# Patient Record
Sex: Male | Born: 1940 | Race: White | Hispanic: No | Marital: Married | Smoking: Never smoker
Health system: Southern US, Community
[De-identification: ages and names within clinical notes are randomized; demographics above are authoritative.]

## PROBLEM LIST (undated history)

## (undated) DIAGNOSIS — I10 Essential (primary) hypertension: Secondary | ICD-10-CM

## (undated) DIAGNOSIS — E119 Type 2 diabetes mellitus without complications: Secondary | ICD-10-CM

---

## 2015-02-13 DIAGNOSIS — I10 Essential (primary) hypertension: Secondary | ICD-10-CM | POA: Diagnosis not present

## 2015-02-13 DIAGNOSIS — E1165 Type 2 diabetes mellitus with hyperglycemia: Secondary | ICD-10-CM | POA: Diagnosis not present

## 2015-02-15 DIAGNOSIS — Z125 Encounter for screening for malignant neoplasm of prostate: Secondary | ICD-10-CM | POA: Diagnosis not present

## 2015-02-15 DIAGNOSIS — E1165 Type 2 diabetes mellitus with hyperglycemia: Secondary | ICD-10-CM | POA: Diagnosis not present

## 2015-02-15 DIAGNOSIS — I10 Essential (primary) hypertension: Secondary | ICD-10-CM | POA: Diagnosis not present

## 2015-04-04 DIAGNOSIS — C61 Malignant neoplasm of prostate: Secondary | ICD-10-CM | POA: Diagnosis not present

## 2015-04-11 DIAGNOSIS — N138 Other obstructive and reflux uropathy: Secondary | ICD-10-CM | POA: Diagnosis not present

## 2015-04-11 DIAGNOSIS — N5201 Erectile dysfunction due to arterial insufficiency: Secondary | ICD-10-CM | POA: Diagnosis not present

## 2015-04-11 DIAGNOSIS — R972 Elevated prostate specific antigen [PSA]: Secondary | ICD-10-CM | POA: Diagnosis not present

## 2015-04-11 DIAGNOSIS — C61 Malignant neoplasm of prostate: Secondary | ICD-10-CM | POA: Diagnosis not present

## 2015-04-11 DIAGNOSIS — Z0000 Encounter for general adult medical examination without abnormal findings: Secondary | ICD-10-CM | POA: Diagnosis not present

## 2015-04-11 DIAGNOSIS — N401 Enlarged prostate with lower urinary tract symptoms: Secondary | ICD-10-CM | POA: Diagnosis not present

## 2015-05-14 DIAGNOSIS — Z0000 Encounter for general adult medical examination without abnormal findings: Secondary | ICD-10-CM | POA: Diagnosis not present

## 2015-05-14 DIAGNOSIS — I10 Essential (primary) hypertension: Secondary | ICD-10-CM | POA: Diagnosis not present

## 2015-05-14 DIAGNOSIS — E1165 Type 2 diabetes mellitus with hyperglycemia: Secondary | ICD-10-CM | POA: Diagnosis not present

## 2015-06-04 DIAGNOSIS — Z1211 Encounter for screening for malignant neoplasm of colon: Secondary | ICD-10-CM | POA: Diagnosis not present

## 2015-08-16 DIAGNOSIS — E1165 Type 2 diabetes mellitus with hyperglycemia: Secondary | ICD-10-CM | POA: Diagnosis not present

## 2015-08-16 DIAGNOSIS — E784 Other hyperlipidemia: Secondary | ICD-10-CM | POA: Diagnosis not present

## 2015-08-16 DIAGNOSIS — I10 Essential (primary) hypertension: Secondary | ICD-10-CM | POA: Diagnosis not present

## 2015-08-21 DIAGNOSIS — E1165 Type 2 diabetes mellitus with hyperglycemia: Secondary | ICD-10-CM | POA: Diagnosis not present

## 2015-08-21 DIAGNOSIS — I10 Essential (primary) hypertension: Secondary | ICD-10-CM | POA: Diagnosis not present

## 2015-08-21 DIAGNOSIS — E784 Other hyperlipidemia: Secondary | ICD-10-CM | POA: Diagnosis not present

## 2015-10-11 DIAGNOSIS — C61 Malignant neoplasm of prostate: Secondary | ICD-10-CM | POA: Diagnosis not present

## 2015-10-17 DIAGNOSIS — C61 Malignant neoplasm of prostate: Secondary | ICD-10-CM | POA: Diagnosis not present

## 2015-10-17 DIAGNOSIS — R972 Elevated prostate specific antigen [PSA]: Secondary | ICD-10-CM | POA: Diagnosis not present

## 2015-10-17 DIAGNOSIS — N5201 Erectile dysfunction due to arterial insufficiency: Secondary | ICD-10-CM | POA: Diagnosis not present

## 2015-11-16 DIAGNOSIS — E784 Other hyperlipidemia: Secondary | ICD-10-CM | POA: Diagnosis not present

## 2015-11-16 DIAGNOSIS — I10 Essential (primary) hypertension: Secondary | ICD-10-CM | POA: Diagnosis not present

## 2015-11-16 DIAGNOSIS — E1165 Type 2 diabetes mellitus with hyperglycemia: Secondary | ICD-10-CM | POA: Diagnosis not present

## 2016-01-08 DIAGNOSIS — Z23 Encounter for immunization: Secondary | ICD-10-CM | POA: Diagnosis not present

## 2016-02-15 DIAGNOSIS — I10 Essential (primary) hypertension: Secondary | ICD-10-CM | POA: Diagnosis not present

## 2016-02-15 DIAGNOSIS — E1165 Type 2 diabetes mellitus with hyperglycemia: Secondary | ICD-10-CM | POA: Diagnosis not present

## 2016-02-15 DIAGNOSIS — E784 Other hyperlipidemia: Secondary | ICD-10-CM | POA: Diagnosis not present

## 2016-05-12 DIAGNOSIS — E784 Other hyperlipidemia: Secondary | ICD-10-CM | POA: Diagnosis not present

## 2016-05-12 DIAGNOSIS — I10 Essential (primary) hypertension: Secondary | ICD-10-CM | POA: Diagnosis not present

## 2016-05-12 DIAGNOSIS — E1165 Type 2 diabetes mellitus with hyperglycemia: Secondary | ICD-10-CM | POA: Diagnosis not present

## 2016-06-04 DIAGNOSIS — Z125 Encounter for screening for malignant neoplasm of prostate: Secondary | ICD-10-CM | POA: Diagnosis not present

## 2016-06-04 DIAGNOSIS — E1165 Type 2 diabetes mellitus with hyperglycemia: Secondary | ICD-10-CM | POA: Diagnosis not present

## 2016-06-13 DIAGNOSIS — C61 Malignant neoplasm of prostate: Secondary | ICD-10-CM | POA: Diagnosis not present

## 2016-06-16 DIAGNOSIS — N5201 Erectile dysfunction due to arterial insufficiency: Secondary | ICD-10-CM | POA: Diagnosis not present

## 2016-06-16 DIAGNOSIS — C61 Malignant neoplasm of prostate: Secondary | ICD-10-CM | POA: Diagnosis not present

## 2016-06-16 DIAGNOSIS — N401 Enlarged prostate with lower urinary tract symptoms: Secondary | ICD-10-CM | POA: Diagnosis not present

## 2016-06-16 DIAGNOSIS — R972 Elevated prostate specific antigen [PSA]: Secondary | ICD-10-CM | POA: Diagnosis not present

## 2016-06-16 DIAGNOSIS — R350 Frequency of micturition: Secondary | ICD-10-CM | POA: Diagnosis not present

## 2016-08-12 DIAGNOSIS — E1165 Type 2 diabetes mellitus with hyperglycemia: Secondary | ICD-10-CM | POA: Diagnosis not present

## 2016-08-12 DIAGNOSIS — I10 Essential (primary) hypertension: Secondary | ICD-10-CM | POA: Diagnosis not present

## 2016-08-12 DIAGNOSIS — E784 Other hyperlipidemia: Secondary | ICD-10-CM | POA: Diagnosis not present

## 2016-09-19 DIAGNOSIS — H524 Presbyopia: Secondary | ICD-10-CM | POA: Diagnosis not present

## 2016-09-19 DIAGNOSIS — E119 Type 2 diabetes mellitus without complications: Secondary | ICD-10-CM | POA: Diagnosis not present

## 2016-09-19 DIAGNOSIS — H5203 Hypermetropia, bilateral: Secondary | ICD-10-CM | POA: Diagnosis not present

## 2016-09-19 DIAGNOSIS — H52223 Regular astigmatism, bilateral: Secondary | ICD-10-CM | POA: Diagnosis not present

## 2016-09-19 DIAGNOSIS — H25813 Combined forms of age-related cataract, bilateral: Secondary | ICD-10-CM | POA: Diagnosis not present

## 2016-11-12 DIAGNOSIS — E7849 Other hyperlipidemia: Secondary | ICD-10-CM | POA: Diagnosis not present

## 2016-11-12 DIAGNOSIS — I10 Essential (primary) hypertension: Secondary | ICD-10-CM | POA: Diagnosis not present

## 2016-11-12 DIAGNOSIS — Z0000 Encounter for general adult medical examination without abnormal findings: Secondary | ICD-10-CM | POA: Diagnosis not present

## 2016-11-12 DIAGNOSIS — Z1389 Encounter for screening for other disorder: Secondary | ICD-10-CM | POA: Diagnosis not present

## 2016-11-12 DIAGNOSIS — E1165 Type 2 diabetes mellitus with hyperglycemia: Secondary | ICD-10-CM | POA: Diagnosis not present

## 2016-11-19 DIAGNOSIS — Z0000 Encounter for general adult medical examination without abnormal findings: Secondary | ICD-10-CM | POA: Diagnosis not present

## 2016-11-19 DIAGNOSIS — Z1389 Encounter for screening for other disorder: Secondary | ICD-10-CM | POA: Diagnosis not present

## 2016-11-19 DIAGNOSIS — E7849 Other hyperlipidemia: Secondary | ICD-10-CM | POA: Diagnosis not present

## 2016-11-19 DIAGNOSIS — E1165 Type 2 diabetes mellitus with hyperglycemia: Secondary | ICD-10-CM | POA: Diagnosis not present

## 2016-11-19 DIAGNOSIS — I10 Essential (primary) hypertension: Secondary | ICD-10-CM | POA: Diagnosis not present

## 2016-12-15 DIAGNOSIS — C61 Malignant neoplasm of prostate: Secondary | ICD-10-CM | POA: Diagnosis not present

## 2016-12-22 DIAGNOSIS — N5201 Erectile dysfunction due to arterial insufficiency: Secondary | ICD-10-CM | POA: Diagnosis not present

## 2016-12-22 DIAGNOSIS — R350 Frequency of micturition: Secondary | ICD-10-CM | POA: Diagnosis not present

## 2016-12-22 DIAGNOSIS — R972 Elevated prostate specific antigen [PSA]: Secondary | ICD-10-CM | POA: Diagnosis not present

## 2016-12-22 DIAGNOSIS — C61 Malignant neoplasm of prostate: Secondary | ICD-10-CM | POA: Diagnosis not present

## 2017-02-12 DIAGNOSIS — I10 Essential (primary) hypertension: Secondary | ICD-10-CM | POA: Diagnosis not present

## 2017-02-12 DIAGNOSIS — E7849 Other hyperlipidemia: Secondary | ICD-10-CM | POA: Diagnosis not present

## 2017-02-12 DIAGNOSIS — E1165 Type 2 diabetes mellitus with hyperglycemia: Secondary | ICD-10-CM | POA: Diagnosis not present

## 2017-05-12 DIAGNOSIS — H25813 Combined forms of age-related cataract, bilateral: Secondary | ICD-10-CM | POA: Diagnosis not present

## 2017-05-19 DIAGNOSIS — E1165 Type 2 diabetes mellitus with hyperglycemia: Secondary | ICD-10-CM | POA: Diagnosis not present

## 2017-05-19 DIAGNOSIS — I10 Essential (primary) hypertension: Secondary | ICD-10-CM | POA: Diagnosis not present

## 2017-05-19 DIAGNOSIS — E7849 Other hyperlipidemia: Secondary | ICD-10-CM | POA: Diagnosis not present

## 2017-05-22 DIAGNOSIS — E7849 Other hyperlipidemia: Secondary | ICD-10-CM | POA: Diagnosis not present

## 2017-05-22 DIAGNOSIS — I10 Essential (primary) hypertension: Secondary | ICD-10-CM | POA: Diagnosis not present

## 2017-05-22 DIAGNOSIS — E1165 Type 2 diabetes mellitus with hyperglycemia: Secondary | ICD-10-CM | POA: Diagnosis not present

## 2017-06-16 DIAGNOSIS — H2512 Age-related nuclear cataract, left eye: Secondary | ICD-10-CM | POA: Diagnosis not present

## 2017-08-18 DIAGNOSIS — I10 Essential (primary) hypertension: Secondary | ICD-10-CM | POA: Diagnosis not present

## 2017-08-18 DIAGNOSIS — E7849 Other hyperlipidemia: Secondary | ICD-10-CM | POA: Diagnosis not present

## 2017-08-18 DIAGNOSIS — E1165 Type 2 diabetes mellitus with hyperglycemia: Secondary | ICD-10-CM | POA: Diagnosis not present

## 2017-10-14 DIAGNOSIS — H2512 Age-related nuclear cataract, left eye: Secondary | ICD-10-CM | POA: Diagnosis not present

## 2017-11-18 DIAGNOSIS — I10 Essential (primary) hypertension: Secondary | ICD-10-CM | POA: Diagnosis not present

## 2017-11-18 DIAGNOSIS — Z1389 Encounter for screening for other disorder: Secondary | ICD-10-CM | POA: Diagnosis not present

## 2017-11-18 DIAGNOSIS — Z0000 Encounter for general adult medical examination without abnormal findings: Secondary | ICD-10-CM | POA: Diagnosis not present

## 2017-11-18 DIAGNOSIS — E7849 Other hyperlipidemia: Secondary | ICD-10-CM | POA: Diagnosis not present

## 2017-11-18 DIAGNOSIS — E1165 Type 2 diabetes mellitus with hyperglycemia: Secondary | ICD-10-CM | POA: Diagnosis not present

## 2017-12-02 DIAGNOSIS — H2511 Age-related nuclear cataract, right eye: Secondary | ICD-10-CM | POA: Diagnosis not present

## 2017-12-10 DIAGNOSIS — Z13820 Encounter for screening for osteoporosis: Secondary | ICD-10-CM | POA: Diagnosis not present

## 2017-12-10 DIAGNOSIS — M810 Age-related osteoporosis without current pathological fracture: Secondary | ICD-10-CM | POA: Diagnosis not present

## 2017-12-16 DIAGNOSIS — C61 Malignant neoplasm of prostate: Secondary | ICD-10-CM | POA: Diagnosis not present

## 2017-12-23 DIAGNOSIS — R350 Frequency of micturition: Secondary | ICD-10-CM | POA: Diagnosis not present

## 2017-12-23 DIAGNOSIS — R972 Elevated prostate specific antigen [PSA]: Secondary | ICD-10-CM | POA: Diagnosis not present

## 2017-12-23 DIAGNOSIS — C61 Malignant neoplasm of prostate: Secondary | ICD-10-CM | POA: Diagnosis not present

## 2017-12-23 DIAGNOSIS — N401 Enlarged prostate with lower urinary tract symptoms: Secondary | ICD-10-CM | POA: Diagnosis not present

## 2017-12-23 DIAGNOSIS — N5201 Erectile dysfunction due to arterial insufficiency: Secondary | ICD-10-CM | POA: Diagnosis not present

## 2018-02-18 DIAGNOSIS — E7849 Other hyperlipidemia: Secondary | ICD-10-CM | POA: Diagnosis not present

## 2018-02-18 DIAGNOSIS — E1165 Type 2 diabetes mellitus with hyperglycemia: Secondary | ICD-10-CM | POA: Diagnosis not present

## 2018-02-18 DIAGNOSIS — Z0000 Encounter for general adult medical examination without abnormal findings: Secondary | ICD-10-CM | POA: Diagnosis not present

## 2018-02-18 DIAGNOSIS — I10 Essential (primary) hypertension: Secondary | ICD-10-CM | POA: Diagnosis not present

## 2018-02-18 DIAGNOSIS — Z1389 Encounter for screening for other disorder: Secondary | ICD-10-CM | POA: Diagnosis not present

## 2018-05-20 DIAGNOSIS — I10 Essential (primary) hypertension: Secondary | ICD-10-CM | POA: Diagnosis not present

## 2018-05-20 DIAGNOSIS — E7849 Other hyperlipidemia: Secondary | ICD-10-CM | POA: Diagnosis not present

## 2018-05-20 DIAGNOSIS — E1165 Type 2 diabetes mellitus with hyperglycemia: Secondary | ICD-10-CM | POA: Diagnosis not present

## 2018-07-05 DIAGNOSIS — H524 Presbyopia: Secondary | ICD-10-CM | POA: Diagnosis not present

## 2018-07-05 DIAGNOSIS — Z961 Presence of intraocular lens: Secondary | ICD-10-CM | POA: Diagnosis not present

## 2018-07-05 DIAGNOSIS — H52223 Regular astigmatism, bilateral: Secondary | ICD-10-CM | POA: Diagnosis not present

## 2018-07-05 DIAGNOSIS — E119 Type 2 diabetes mellitus without complications: Secondary | ICD-10-CM | POA: Diagnosis not present

## 2018-08-24 DIAGNOSIS — E7849 Other hyperlipidemia: Secondary | ICD-10-CM | POA: Diagnosis not present

## 2018-08-24 DIAGNOSIS — I10 Essential (primary) hypertension: Secondary | ICD-10-CM | POA: Diagnosis not present

## 2018-08-24 DIAGNOSIS — E119 Type 2 diabetes mellitus without complications: Secondary | ICD-10-CM | POA: Diagnosis not present

## 2018-08-25 ENCOUNTER — Other Ambulatory Visit: Payer: Self-pay

## 2018-08-30 DIAGNOSIS — I10 Essential (primary) hypertension: Secondary | ICD-10-CM | POA: Diagnosis not present

## 2018-08-30 DIAGNOSIS — E119 Type 2 diabetes mellitus without complications: Secondary | ICD-10-CM | POA: Diagnosis not present

## 2018-12-20 DIAGNOSIS — C61 Malignant neoplasm of prostate: Secondary | ICD-10-CM | POA: Diagnosis not present

## 2018-12-29 DIAGNOSIS — C61 Malignant neoplasm of prostate: Secondary | ICD-10-CM | POA: Diagnosis not present

## 2018-12-29 DIAGNOSIS — R972 Elevated prostate specific antigen [PSA]: Secondary | ICD-10-CM | POA: Diagnosis not present

## 2018-12-29 DIAGNOSIS — R351 Nocturia: Secondary | ICD-10-CM | POA: Diagnosis not present

## 2018-12-29 DIAGNOSIS — N401 Enlarged prostate with lower urinary tract symptoms: Secondary | ICD-10-CM | POA: Diagnosis not present

## 2019-01-04 DIAGNOSIS — Z1389 Encounter for screening for other disorder: Secondary | ICD-10-CM | POA: Diagnosis not present

## 2019-01-04 DIAGNOSIS — Z0000 Encounter for general adult medical examination without abnormal findings: Secondary | ICD-10-CM | POA: Diagnosis not present

## 2019-01-04 DIAGNOSIS — I10 Essential (primary) hypertension: Secondary | ICD-10-CM | POA: Diagnosis not present

## 2019-01-04 DIAGNOSIS — E119 Type 2 diabetes mellitus without complications: Secondary | ICD-10-CM | POA: Diagnosis not present

## 2019-01-04 DIAGNOSIS — E7849 Other hyperlipidemia: Secondary | ICD-10-CM | POA: Diagnosis not present

## 2019-02-24 DIAGNOSIS — I10 Essential (primary) hypertension: Secondary | ICD-10-CM | POA: Diagnosis not present

## 2019-02-24 DIAGNOSIS — E119 Type 2 diabetes mellitus without complications: Secondary | ICD-10-CM | POA: Diagnosis not present

## 2019-02-24 DIAGNOSIS — E7849 Other hyperlipidemia: Secondary | ICD-10-CM | POA: Diagnosis not present

## 2019-03-21 DIAGNOSIS — C61 Malignant neoplasm of prostate: Secondary | ICD-10-CM | POA: Diagnosis not present

## 2019-04-05 DIAGNOSIS — E1121 Type 2 diabetes mellitus with diabetic nephropathy: Secondary | ICD-10-CM | POA: Diagnosis not present

## 2019-04-05 DIAGNOSIS — E7849 Other hyperlipidemia: Secondary | ICD-10-CM | POA: Diagnosis not present

## 2019-04-05 DIAGNOSIS — I10 Essential (primary) hypertension: Secondary | ICD-10-CM | POA: Diagnosis not present

## 2019-06-08 DIAGNOSIS — C61 Malignant neoplasm of prostate: Secondary | ICD-10-CM | POA: Diagnosis not present

## 2019-06-15 DIAGNOSIS — R972 Elevated prostate specific antigen [PSA]: Secondary | ICD-10-CM | POA: Diagnosis not present

## 2019-06-15 DIAGNOSIS — C61 Malignant neoplasm of prostate: Secondary | ICD-10-CM | POA: Diagnosis not present

## 2019-07-06 DIAGNOSIS — E1121 Type 2 diabetes mellitus with diabetic nephropathy: Secondary | ICD-10-CM | POA: Diagnosis not present

## 2019-07-06 DIAGNOSIS — I10 Essential (primary) hypertension: Secondary | ICD-10-CM | POA: Diagnosis not present

## 2019-07-06 DIAGNOSIS — E7849 Other hyperlipidemia: Secondary | ICD-10-CM | POA: Diagnosis not present

## 2019-07-06 DIAGNOSIS — N182 Chronic kidney disease, stage 2 (mild): Secondary | ICD-10-CM | POA: Diagnosis not present

## 2019-07-08 DIAGNOSIS — E1121 Type 2 diabetes mellitus with diabetic nephropathy: Secondary | ICD-10-CM | POA: Diagnosis not present

## 2019-07-08 DIAGNOSIS — I10 Essential (primary) hypertension: Secondary | ICD-10-CM | POA: Diagnosis not present

## 2019-10-05 DIAGNOSIS — I10 Essential (primary) hypertension: Secondary | ICD-10-CM | POA: Diagnosis not present

## 2019-10-05 DIAGNOSIS — E7849 Other hyperlipidemia: Secondary | ICD-10-CM | POA: Diagnosis not present

## 2019-10-05 DIAGNOSIS — N182 Chronic kidney disease, stage 2 (mild): Secondary | ICD-10-CM | POA: Diagnosis not present

## 2019-10-05 DIAGNOSIS — E1121 Type 2 diabetes mellitus with diabetic nephropathy: Secondary | ICD-10-CM | POA: Diagnosis not present

## 2019-12-07 DIAGNOSIS — C61 Malignant neoplasm of prostate: Secondary | ICD-10-CM | POA: Diagnosis not present

## 2019-12-14 DIAGNOSIS — C61 Malignant neoplasm of prostate: Secondary | ICD-10-CM | POA: Diagnosis not present

## 2019-12-20 ENCOUNTER — Other Ambulatory Visit: Payer: Self-pay | Admitting: Urology

## 2019-12-20 DIAGNOSIS — C61 Malignant neoplasm of prostate: Secondary | ICD-10-CM

## 2019-12-20 DIAGNOSIS — R972 Elevated prostate specific antigen [PSA]: Secondary | ICD-10-CM

## 2020-01-04 DIAGNOSIS — E1121 Type 2 diabetes mellitus with diabetic nephropathy: Secondary | ICD-10-CM | POA: Diagnosis not present

## 2020-01-04 DIAGNOSIS — N182 Chronic kidney disease, stage 2 (mild): Secondary | ICD-10-CM | POA: Diagnosis not present

## 2020-01-04 DIAGNOSIS — E7849 Other hyperlipidemia: Secondary | ICD-10-CM | POA: Diagnosis not present

## 2020-01-04 DIAGNOSIS — I10 Essential (primary) hypertension: Secondary | ICD-10-CM | POA: Diagnosis not present

## 2020-01-11 ENCOUNTER — Other Ambulatory Visit: Payer: Self-pay

## 2020-01-11 ENCOUNTER — Ambulatory Visit
Admission: RE | Admit: 2020-01-11 | Discharge: 2020-01-11 | Disposition: A | Discharge status: Home / Self Care | Payer: Medicare Other | Source: Ambulatory Visit | Attending: Urology | Admitting: Urology

## 2020-01-11 DIAGNOSIS — R972 Elevated prostate specific antigen [PSA]: Secondary | ICD-10-CM

## 2020-01-11 DIAGNOSIS — N400 Enlarged prostate without lower urinary tract symptoms: Secondary | ICD-10-CM | POA: Diagnosis not present

## 2020-01-11 DIAGNOSIS — C61 Malignant neoplasm of prostate: Secondary | ICD-10-CM

## 2020-01-11 MED ORDER — GADOBENATE DIMEGLUMINE 529 MG/ML IV SOLN
15.0000 mL | Freq: Once | INTRAVENOUS | Status: AC | PRN
  Administered 2020-01-11: 15 mL via INTRAVENOUS

## 2020-01-26 DIAGNOSIS — Z23 Encounter for immunization: Secondary | ICD-10-CM | POA: Diagnosis not present

## 2020-04-12 DIAGNOSIS — N182 Chronic kidney disease, stage 2 (mild): Secondary | ICD-10-CM | POA: Diagnosis not present

## 2020-04-12 DIAGNOSIS — Z0000 Encounter for general adult medical examination without abnormal findings: Secondary | ICD-10-CM | POA: Diagnosis not present

## 2020-04-12 DIAGNOSIS — I10 Essential (primary) hypertension: Secondary | ICD-10-CM | POA: Diagnosis not present

## 2020-04-12 DIAGNOSIS — E7849 Other hyperlipidemia: Secondary | ICD-10-CM | POA: Diagnosis not present

## 2020-04-12 DIAGNOSIS — E1121 Type 2 diabetes mellitus with diabetic nephropathy: Secondary | ICD-10-CM | POA: Diagnosis not present

## 2020-04-12 DIAGNOSIS — Z1331 Encounter for screening for depression: Secondary | ICD-10-CM | POA: Diagnosis not present

## 2020-07-16 DIAGNOSIS — Z0000 Encounter for general adult medical examination without abnormal findings: Secondary | ICD-10-CM | POA: Diagnosis not present

## 2020-07-16 DIAGNOSIS — E1121 Type 2 diabetes mellitus with diabetic nephropathy: Secondary | ICD-10-CM | POA: Diagnosis not present

## 2020-07-16 DIAGNOSIS — I10 Essential (primary) hypertension: Secondary | ICD-10-CM | POA: Diagnosis not present

## 2020-07-16 DIAGNOSIS — Z1331 Encounter for screening for depression: Secondary | ICD-10-CM | POA: Diagnosis not present

## 2020-07-16 DIAGNOSIS — E7849 Other hyperlipidemia: Secondary | ICD-10-CM | POA: Diagnosis not present

## 2020-07-16 DIAGNOSIS — N182 Chronic kidney disease, stage 2 (mild): Secondary | ICD-10-CM | POA: Diagnosis not present

## 2020-08-27 DIAGNOSIS — C61 Malignant neoplasm of prostate: Secondary | ICD-10-CM | POA: Diagnosis not present

## 2020-09-03 DIAGNOSIS — C61 Malignant neoplasm of prostate: Secondary | ICD-10-CM | POA: Diagnosis not present

## 2020-09-03 DIAGNOSIS — R972 Elevated prostate specific antigen [PSA]: Secondary | ICD-10-CM | POA: Diagnosis not present

## 2020-09-03 DIAGNOSIS — R351 Nocturia: Secondary | ICD-10-CM | POA: Diagnosis not present

## 2020-09-03 DIAGNOSIS — N401 Enlarged prostate with lower urinary tract symptoms: Secondary | ICD-10-CM | POA: Diagnosis not present

## 2020-10-11 DIAGNOSIS — Z23 Encounter for immunization: Secondary | ICD-10-CM | POA: Diagnosis not present

## 2020-10-15 DIAGNOSIS — E1121 Type 2 diabetes mellitus with diabetic nephropathy: Secondary | ICD-10-CM | POA: Diagnosis not present

## 2020-10-15 DIAGNOSIS — I10 Essential (primary) hypertension: Secondary | ICD-10-CM | POA: Diagnosis not present

## 2020-10-15 DIAGNOSIS — N182 Chronic kidney disease, stage 2 (mild): Secondary | ICD-10-CM | POA: Diagnosis not present

## 2020-10-15 DIAGNOSIS — E7849 Other hyperlipidemia: Secondary | ICD-10-CM | POA: Diagnosis not present

## 2020-10-19 DIAGNOSIS — E1121 Type 2 diabetes mellitus with diabetic nephropathy: Secondary | ICD-10-CM | POA: Diagnosis not present

## 2020-10-19 DIAGNOSIS — N182 Chronic kidney disease, stage 2 (mild): Secondary | ICD-10-CM | POA: Diagnosis not present

## 2020-10-19 DIAGNOSIS — I10 Essential (primary) hypertension: Secondary | ICD-10-CM | POA: Diagnosis not present

## 2020-10-19 DIAGNOSIS — E7849 Other hyperlipidemia: Secondary | ICD-10-CM | POA: Diagnosis not present

## 2020-10-26 DIAGNOSIS — E7849 Other hyperlipidemia: Secondary | ICD-10-CM | POA: Diagnosis not present

## 2020-10-26 DIAGNOSIS — I10 Essential (primary) hypertension: Secondary | ICD-10-CM | POA: Diagnosis not present

## 2020-10-26 DIAGNOSIS — E1121 Type 2 diabetes mellitus with diabetic nephropathy: Secondary | ICD-10-CM | POA: Diagnosis not present

## 2020-10-26 DIAGNOSIS — N182 Chronic kidney disease, stage 2 (mild): Secondary | ICD-10-CM | POA: Diagnosis not present

## 2020-11-08 DIAGNOSIS — Z13820 Encounter for screening for osteoporosis: Secondary | ICD-10-CM | POA: Diagnosis not present

## 2020-11-08 DIAGNOSIS — M810 Age-related osteoporosis without current pathological fracture: Secondary | ICD-10-CM | POA: Diagnosis not present

## 2020-11-26 DIAGNOSIS — E1121 Type 2 diabetes mellitus with diabetic nephropathy: Secondary | ICD-10-CM | POA: Diagnosis not present

## 2020-11-26 DIAGNOSIS — I10 Essential (primary) hypertension: Secondary | ICD-10-CM | POA: Diagnosis not present

## 2020-11-26 DIAGNOSIS — N182 Chronic kidney disease, stage 2 (mild): Secondary | ICD-10-CM | POA: Diagnosis not present

## 2020-11-26 DIAGNOSIS — E7849 Other hyperlipidemia: Secondary | ICD-10-CM | POA: Diagnosis not present

## 2021-01-14 DIAGNOSIS — E1121 Type 2 diabetes mellitus with diabetic nephropathy: Secondary | ICD-10-CM | POA: Diagnosis not present

## 2021-01-14 DIAGNOSIS — E7849 Other hyperlipidemia: Secondary | ICD-10-CM | POA: Diagnosis not present

## 2021-01-14 DIAGNOSIS — I10 Essential (primary) hypertension: Secondary | ICD-10-CM | POA: Diagnosis not present

## 2021-01-14 DIAGNOSIS — N182 Chronic kidney disease, stage 2 (mild): Secondary | ICD-10-CM | POA: Diagnosis not present

## 2021-03-01 DIAGNOSIS — C61 Malignant neoplasm of prostate: Secondary | ICD-10-CM | POA: Diagnosis not present

## 2021-03-08 DIAGNOSIS — R972 Elevated prostate specific antigen [PSA]: Secondary | ICD-10-CM | POA: Diagnosis not present

## 2021-03-08 DIAGNOSIS — C61 Malignant neoplasm of prostate: Secondary | ICD-10-CM | POA: Diagnosis not present

## 2021-04-15 DIAGNOSIS — Z1331 Encounter for screening for depression: Secondary | ICD-10-CM | POA: Diagnosis not present

## 2021-04-15 DIAGNOSIS — Z0000 Encounter for general adult medical examination without abnormal findings: Secondary | ICD-10-CM | POA: Diagnosis not present

## 2021-04-15 DIAGNOSIS — I10 Essential (primary) hypertension: Secondary | ICD-10-CM | POA: Diagnosis not present

## 2021-04-15 DIAGNOSIS — E1121 Type 2 diabetes mellitus with diabetic nephropathy: Secondary | ICD-10-CM | POA: Diagnosis not present

## 2021-04-15 DIAGNOSIS — N182 Chronic kidney disease, stage 2 (mild): Secondary | ICD-10-CM | POA: Diagnosis not present

## 2021-04-15 DIAGNOSIS — E7849 Other hyperlipidemia: Secondary | ICD-10-CM | POA: Diagnosis not present

## 2021-07-23 DIAGNOSIS — E860 Dehydration: Secondary | ICD-10-CM | POA: Diagnosis not present

## 2021-07-23 DIAGNOSIS — R630 Anorexia: Secondary | ICD-10-CM | POA: Diagnosis not present

## 2021-07-23 DIAGNOSIS — R1084 Generalized abdominal pain: Secondary | ICD-10-CM | POA: Diagnosis not present

## 2021-07-25 ENCOUNTER — Other Ambulatory Visit (HOSPITAL_COMMUNITY): Payer: Self-pay

## 2021-07-25 ENCOUNTER — Other Ambulatory Visit: Payer: Self-pay

## 2021-07-25 ENCOUNTER — Encounter (HOSPITAL_COMMUNITY): Payer: Self-pay | Admitting: *Deleted

## 2021-07-25 ENCOUNTER — Emergency Department (HOSPITAL_COMMUNITY)
Admission: EM | Admit: 2021-07-25 | Discharge: 2021-07-25 | Disposition: A | Discharge status: Home / Self Care | Payer: Medicare Other | Attending: Emergency Medicine | Admitting: Emergency Medicine

## 2021-07-25 ENCOUNTER — Emergency Department (HOSPITAL_COMMUNITY): Payer: Medicare Other

## 2021-07-25 DIAGNOSIS — R531 Weakness: Secondary | ICD-10-CM | POA: Diagnosis not present

## 2021-07-25 DIAGNOSIS — K8689 Other specified diseases of pancreas: Secondary | ICD-10-CM

## 2021-07-25 DIAGNOSIS — K869 Disease of pancreas, unspecified: Secondary | ICD-10-CM | POA: Diagnosis not present

## 2021-07-25 DIAGNOSIS — R634 Abnormal weight loss: Secondary | ICD-10-CM | POA: Insufficient documentation

## 2021-07-25 DIAGNOSIS — K769 Liver disease, unspecified: Secondary | ICD-10-CM | POA: Diagnosis not present

## 2021-07-25 DIAGNOSIS — Z79899 Other long term (current) drug therapy: Secondary | ICD-10-CM | POA: Insufficient documentation

## 2021-07-25 DIAGNOSIS — K8020 Calculus of gallbladder without cholecystitis without obstruction: Secondary | ICD-10-CM | POA: Insufficient documentation

## 2021-07-25 DIAGNOSIS — N400 Enlarged prostate without lower urinary tract symptoms: Secondary | ICD-10-CM | POA: Diagnosis not present

## 2021-07-25 DIAGNOSIS — R109 Unspecified abdominal pain: Secondary | ICD-10-CM | POA: Diagnosis not present

## 2021-07-25 DIAGNOSIS — I10 Essential (primary) hypertension: Secondary | ICD-10-CM | POA: Diagnosis not present

## 2021-07-25 HISTORY — DX: Essential (primary) hypertension: I10

## 2021-07-25 HISTORY — DX: Type 2 diabetes mellitus without complications: E11.9

## 2021-07-25 LAB — COMPREHENSIVE METABOLIC PANEL
ALT: 46 U/L — ABNORMAL HIGH (ref 0–44)
AST: 40 U/L (ref 15–41)
Albumin: 2.6 g/dL — ABNORMAL LOW (ref 3.5–5.0)
Alkaline Phosphatase: 257 U/L — ABNORMAL HIGH (ref 38–126)
Anion gap: 11 (ref 5–15)
BUN: 36 mg/dL — ABNORMAL HIGH (ref 8–23)
CO2: 24 mmol/L (ref 22–32)
Calcium: 8.7 mg/dL — ABNORMAL LOW (ref 8.9–10.3)
Chloride: 98 mmol/L (ref 98–111)
Creatinine, Ser: 1 mg/dL (ref 0.61–1.24)
GFR, Estimated: >60 mL/min (ref >60)
Glucose, Bld: 358 mg/dL — ABNORMAL HIGH (ref 70–99)
Potassium: 4.1 mmol/L (ref 3.5–5.1)
Sodium: 133 mmol/L — ABNORMAL LOW (ref 135–145)
Total Bilirubin: 1 mg/dL (ref 0.3–1.2)
Total Protein: 6.2 g/dL — ABNORMAL LOW (ref 6.5–8.1)

## 2021-07-25 LAB — CBC WITH DIFFERENTIAL/PLATELET
Abs Immature Granulocytes: 0.04 10*3/uL (ref 0.00–0.07)
Basophils Absolute: 0 10*3/uL (ref 0.0–0.1)
Basophils Relative: 0 %
Eosinophils Absolute: 0 10*3/uL (ref 0.0–0.5)
Eosinophils Relative: 0 %
HCT: 31.9 % — ABNORMAL LOW (ref 39.0–52.0)
Hemoglobin: 10.6 g/dL — ABNORMAL LOW (ref 13.0–17.0)
Immature Granulocytes: 0 %
Lymphocytes Relative: 6 %
Lymphs Abs: 0.6 10*3/uL — ABNORMAL LOW (ref 0.7–4.0)
MCH: 29 pg (ref 26.0–34.0)
MCHC: 33.2 g/dL (ref 30.0–36.0)
MCV: 87.4 fL (ref 80.0–100.0)
Monocytes Absolute: 0.8 10*3/uL (ref 0.1–1.0)
Monocytes Relative: 9 %
Neutro Abs: 7.7 10*3/uL (ref 1.7–7.7)
Neutrophils Relative %: 85 %
Platelets: 327 10*3/uL (ref 150–400)
RBC: 3.65 MIL/uL — ABNORMAL LOW (ref 4.22–5.81)
RDW: 14.6 % (ref 11.5–15.5)
WBC: 9.2 10*3/uL (ref 4.0–10.5)
nRBC: 0 % (ref 0.0–0.2)

## 2021-07-25 LAB — URINALYSIS, ROUTINE W REFLEX MICROSCOPIC
Bacteria, UA: NONE SEEN
Bilirubin Urine: NEGATIVE
Glucose, UA: >=500 mg/dL — AB
Hgb urine dipstick: NEGATIVE
Ketones, ur: NEGATIVE mg/dL
Leukocytes,Ua: NEGATIVE
Nitrite: NEGATIVE
Protein, ur: NEGATIVE mg/dL
Specific Gravity, Urine: 1.036 — ABNORMAL HIGH (ref 1.005–1.030)
pH: 5 (ref 5.0–8.0)

## 2021-07-25 LAB — LIPASE, BLOOD: Lipase: 48 U/L (ref 11–51)

## 2021-07-25 MED ORDER — IOHEXOL 300 MG/ML  SOLN
100.0000 mL | Freq: Once | INTRAMUSCULAR | Status: AC | PRN
  Administered 2021-07-25: 100 mL via INTRAVENOUS

## 2021-07-25 MED ORDER — SODIUM CHLORIDE 0.9 % IV BOLUS
1000.0000 mL | Freq: Once | INTRAVENOUS | Status: AC
  Administered 2021-07-25: 1000 mL via INTRAVENOUS

## 2021-07-25 MED ORDER — ONDANSETRON 4 MG PO TBDP: ORAL_TABLET | ORAL | 0 refills | Status: AC

## 2021-07-25 NOTE — ED Notes (Signed)
ED Provider at bedside. 

## 2021-07-25 NOTE — ED Provider Notes (Signed)
[facility] Provider Note   CSN: [id number] Arrival date & time: 07/25/21  2595     History  Chief Complaint  Patient presents with   Weight Loss   Weakness    Jeffery Bryant is a 81 y.o. male.  Patient states that he is lost 10 to 15 pounds over the last few weeks.  He also has been weak.  His doctor saw him today and told him he needed to come to the emergency department to get a CAT scan and some fluids.  Patient has a history of hypertension  The history is provided by the patient and medical records. No language interpreter was used.  Weakness Severity:  Moderate Onset quality:  Sudden Timing:  Constant Progression:  Worsening Chronicity:  New Context: not alcohol use   Relieved by:  Nothing Worsened by:  Nothing Associated symptoms: no abdominal pain, no chest pain, no cough, no diarrhea, no frequency, no headaches and no seizures        Home Medications Prior to Admission medications   Medication Sig Start Date End Date Taking? Authorizing Provider  acarbose (PRECOSE) 100 MG tablet Take 50 mg by mouth in the morning and at bedtime. 07/08/21  Yes [provider]  amLODipine (NORVASC) 5 MG tablet Take 5 mg by mouth daily. 07/16/21  Yes [provider]  atorvastatin (LIPITOR) 40 MG tablet Take 20 mg by mouth daily. 05/28/21  Yes [provider]  BYDUREON BCISE 2 MG/0.85ML AUIJ Inject 1 Syringe into the skin once a week. 06/26/21  Yes [provider]  JARDIANCE 10 MG TABS tablet Take 10 mg by mouth daily. 07/08/21  Yes [provider]  metFORMIN (GLUCOPHAGE) 1000 MG tablet Take 2,500 mg by mouth daily. 1 and 1/2 tablet in am, 1 tablet at night 07/08/21  Yes [provider]  ondansetron (ZOFRAN-ODT) 4 MG disintegrating tablet '4mg'$  ODT q4 hours prn nausea/vomit 07/25/21  Yes Milton Ferguson, MD  valsartan-hydrochlorothiazide (DIOVAN-HCT) 320-25 MG tablet Take 1 tablet by mouth daily. 07/08/21  Yes [provider]      Allergies    Ciprofloxacin    Review of Systems   Review of Systems  Constitutional:  Negative for appetite change and fatigue.  HENT:  Negative for congestion, ear discharge and sinus pressure.   Eyes:  Negative for discharge.  Respiratory:  Negative for cough.   Cardiovascular:  Negative for chest pain.  Gastrointestinal:  Negative for abdominal pain and diarrhea.  Genitourinary:  Negative for frequency and hematuria.  Musculoskeletal:  Negative for back pain.  Skin:  Negative for rash.  Neurological:  Positive for weakness. Negative for seizures and headaches.  Psychiatric/Behavioral:  Negative for hallucinations.     Physical Exam Updated Vital Signs BP 115/69   Pulse 71   Temp (!) 97.5 F (36.4 C) (Oral)   Resp 20   Ht '5\' 10"'$  (1.778 m)   Wt 59.4 kg   SpO2 98%   BMI 18.80 kg/m  Physical Exam Vitals and nursing note reviewed.  Constitutional:      Appearance: He is well-developed.  HENT:     Head: Normocephalic.     Nose: Nose normal.  Eyes:     General: No scleral icterus.    Conjunctiva/sclera: Conjunctivae normal.  Neck:     Thyroid: No thyromegaly.  Cardiovascular:     Rate and Rhythm: Normal rate and regular rhythm.     Heart sounds: No murmur heard.    No friction  rub. No gallop.  Pulmonary:     Breath sounds: No stridor. No wheezing or rales.  Chest:     Chest wall: No tenderness.  Abdominal:     General: There is no distension.     Tenderness: There is no abdominal tenderness. There is no rebound.  Musculoskeletal:        General: Normal range of motion.     Cervical back: Neck supple.  Lymphadenopathy:     Cervical: No cervical adenopathy.  Skin:    Findings: No erythema or rash.  Neurological:     Mental Status: He is alert and oriented to person, place, and time.     Motor: No abnormal muscle tone.     Coordination: Coordination normal.  Psychiatric:        Behavior: Behavior normal.     ED Results / Procedures  / Treatments   Labs (all labs ordered are listed, but only abnormal results are displayed) Labs Reviewed  CBC WITH DIFFERENTIAL/PLATELET - Abnormal; Notable for the following components:      Result Value   RBC 3.65 (*)    Hemoglobin 10.6 (*)    HCT 31.9 (*)    Lymphs Abs 0.6 (*)    All other components within normal limits  COMPREHENSIVE METABOLIC PANEL - Abnormal; Notable for the following components:   Sodium 133 (*)    Glucose, Bld 358 (*)    BUN 36 (*)    Calcium 8.7 (*)    Total Protein 6.2 (*)    Albumin 2.6 (*)    ALT 46 (*)    Alkaline Phosphatase 257 (*)    All other components within normal limits  URINALYSIS, ROUTINE W REFLEX MICROSCOPIC - Abnormal; Notable for the following components:   Color, Urine STRAW (*)    Specific Gravity, Urine 1.036 (*)    Glucose, UA >=500 (*)    All other components within normal limits  LIPASE, BLOOD  CANCER ANTIGEN 19-9  CEA    EKG None  Radiology CT ABDOMEN PELVIS W CONTRAST  Result Date: 07/25/2021 CLINICAL DATA:  Abdominal pain EXAM: CT ABDOMEN AND PELVIS WITH CONTRAST TECHNIQUE: Multidetector CT imaging of the abdomen and pelvis was performed using the standard protocol following bolus administration of intravenous contrast. RADIATION DOSE REDUCTION: This exam was performed according to the departmental dose-optimization program which includes automated exposure control, adjustment of the mA and/or kV according to patient size and/or use of iterative reconstruction technique. CONTRAST:  129m OMNIPAQUE IOHEXOL 300 MG/ML  SOLN COMPARISON:  None Available. FINDINGS: Lower chest: Coronary artery calcifications are seen. Visualized lower lung fields are clear of any focal infiltrates. Hepatobiliary: There are multiple space-occupying lesions of varying sizes measuring up to 5.4 cm. There is no dilation of bile ducts. Gallbladder stone is seen. Pancreas: There is prominence pancreatic duct. There is 4.3 x 2.4 cm low-density  space-occupying lesion in the body of pancreas. Spleen: Unremarkable. Adrenals/Urinary Tract: Adrenals are unremarkable. There is no hydronephrosis. There are no renal or ureteral stones. Urinary bladder is distended. There is no wall thickening in the bladder. Stomach/Bowel: Stomach is unremarkable. Small bowel loops are not dilated. Appendix is not dilated. There is no significant wall thickening in colon. There is no pericolic stranding. Vascular/Lymphatic: Scattered atherosclerotic plaques and calcifications are seen in the aorta and its major branches. Reproductive: There is marked enlargement of prostate projecting into the base of the bladder. Other: There is no ascites or pneumoperitoneum. Left inguinal hernia containing fat is seen.  Musculoskeletal: No focal lytic or sclerotic lesions are seen. Degenerative changes are noted in the thoracic and lumbar spine. IMPRESSION: There is 4.3 cm low-density space-occupying lesion in the body of pancreas. There are multiple space-occupying lesions of varying sizes in the liver. 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 Follow-up PET-CT and tissue sampling should be considered. There is no evidence of intestinal obstruction or pneumoperitoneum. There is no hydronephrosis. Gallbladder stone. Marked enlargement of prostate. Coronary artery calcifications are seen. Other findings as described in the body of the report. Electronically Signed   By: Elmer Picker M.D.   On: 07/25/2021 11:42   DG Chest Port 1 View  Result Date: 07/25/2021 CLINICAL DATA:  Weakness and weight loss EXAM: PORTABLE CHEST 1 VIEW COMPARISON:  None FINDINGS: Artifact overlies the chest. Heart size is normal. Ordinary age related aortic atherosclerosis. The lungs are clear. No evidence of chronic lung disease. No infiltrate, collapse or effusion. No mass or nodule. No abnormal bone finding. IMPRESSION: No active disease.  Electronically Signed   By: Nelson Chimes M.D.   On: 07/25/2021 09:43    Procedures Procedures    Medications Ordered in ED Medications  sodium chloride 0.9 % bolus 1,000 mL (0 mLs Intravenous Stopped 07/25/21 1111)  sodium chloride 0.9 % bolus 1,000 mL (0 mLs Intravenous Stopped 07/25/21 1254)  iohexol (OMNIPAQUE) 300 MG/ML solution 100 mL (100 mLs Intravenous Contrast Given 07/25/21 1106)    ED Course/ Medical Decision Making/ A&P Patient has a pancreatic mass and liver lesions.  I spoke with Dr. Delton Coombes and we will arrange an outpatient liver biopsy and follow-up with Dr. Delton Coombes after that                         Medical Decision Making Amount and/or Complexity of Data Reviewed Labs: ordered. Radiology: ordered. ECG/medicine tests: ordered.  Risk Prescription drug management.  This patient presents to the ED for concern of weakness, this involves an extensive number of treatment options, and is a complaint that carries with it a high risk of complications and morbidity.  The differential diagnosis includes tumor, poor nutrition   Co morbidities that complicate the patient evaluation  Hypertension   Additional history obtained:  Additional history obtained from family External records from outside source obtained and reviewed including hospital records   Lab Tests:  I Ordered, and personally interpreted labs.  The pertinent results include: Hemoglobin 10.6 glucose 358   Imaging Studies ordered:  I ordered imaging studies including CT abdomen I independently visualized and interpreted imaging which showed probable pancreatic cancer and liver mets I agree with the radiologist interpretation   Cardiac Monitoring: / EKG:  The patient was maintained on a cardiac monitor.  I personally viewed and interpreted the cardiac monitored which showed an underlying rhythm of: Normal sinus rhythm   Consultations Obtained:  I requested consultation with the oncology,   and discussed lab and imaging findings as well as pertinent plan - they recommend: Follow-up in the office  Problem List / ED Course / Critical interventions / Medication management  Weakness I ordered medication including normal saline for dehydration Reevaluation of the patient after these medicines showed that the patient improved I have reviewed the patients home medicines and have made adjustments as needed   Social Determinants of Health:  None   Test / Admission - Considered:  No additional testing needed.  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  He is getting a liver biopsy as an outpatient and following up with the oncologist        Final Clinical Impression(s) / ED Diagnoses Final diagnoses:  Weakness    Rx / DC Orders ED Discharge Orders          Ordered    ondansetron (ZOFRAN-ODT) 4 MG disintegrating tablet        07/25/21 1419              Milton Ferguson, MD 07/26/21 1818

## 2021-07-25 NOTE — Discharge Instructions (Signed)
Follow up with Dr. Delton Coombes after your biopsy

## 2021-07-25 NOTE — Progress Notes (Signed)
Biopsy order placed per Dr. Katragadda 

## 2021-07-25 NOTE — ED Triage Notes (Signed)
Pt c/o 10lb weight loss, generalized weakness (mainly when ambulating), abdominal discomfort x couple of months. Pt saw Dr. Angelique Holm yesterday and the day before and was told he had low BP and needed to come to the ER for IV fluids and a CT scan of his abdomen.

## 2021-07-26 LAB — CEA: CEA: 310 ng/mL — ABNORMAL HIGH (ref 0.0–4.7)

## 2021-07-27 LAB — CANCER ANTIGEN 19-9: CA 19-9: 14984 U/mL — ABNORMAL HIGH (ref 0–35)

## 2021-08-01 NOTE — Progress Notes (Unsigned)
Jeffery Peaches, MD  Donita Brooks D; P [facility] Procedure Requests Approved for US guided core biopsy of liver mets.  Probable pancreatic primary.   [place]

## 2021-08-07 ENCOUNTER — Encounter (HOSPITAL_COMMUNITY): Payer: Self-pay | Admitting: Hematology

## 2021-08-07 ENCOUNTER — Inpatient Hospital Stay (HOSPITAL_COMMUNITY): Payer: Medicare Other

## 2021-08-07 ENCOUNTER — Inpatient Hospital Stay (HOSPITAL_COMMUNITY): Payer: Medicare Other | Attending: Hematology | Admitting: Hematology

## 2021-08-07 ENCOUNTER — Other Ambulatory Visit: Payer: Self-pay

## 2021-08-07 DIAGNOSIS — R160 Hepatomegaly, not elsewhere classified: Secondary | ICD-10-CM | POA: Diagnosis not present

## 2021-08-07 DIAGNOSIS — Z812 Family history of tobacco abuse and dependence: Secondary | ICD-10-CM | POA: Insufficient documentation

## 2021-08-07 DIAGNOSIS — M7989 Other specified soft tissue disorders: Secondary | ICD-10-CM | POA: Insufficient documentation

## 2021-08-07 DIAGNOSIS — D649 Anemia, unspecified: Secondary | ICD-10-CM | POA: Insufficient documentation

## 2021-08-07 DIAGNOSIS — R531 Weakness: Secondary | ICD-10-CM | POA: Insufficient documentation

## 2021-08-07 DIAGNOSIS — Z881 Allergy status to other antibiotic agents status: Secondary | ICD-10-CM | POA: Diagnosis not present

## 2021-08-07 DIAGNOSIS — I10 Essential (primary) hypertension: Secondary | ICD-10-CM | POA: Diagnosis not present

## 2021-08-07 DIAGNOSIS — Z801 Family history of malignant neoplasm of trachea, bronchus and lung: Secondary | ICD-10-CM | POA: Insufficient documentation

## 2021-08-07 DIAGNOSIS — C61 Malignant neoplasm of prostate: Secondary | ICD-10-CM

## 2021-08-07 DIAGNOSIS — K8689 Other specified diseases of pancreas: Secondary | ICD-10-CM | POA: Diagnosis not present

## 2021-08-07 DIAGNOSIS — C259 Malignant neoplasm of pancreas, unspecified: Secondary | ICD-10-CM

## 2021-08-07 DIAGNOSIS — R1110 Vomiting, unspecified: Secondary | ICD-10-CM | POA: Diagnosis not present

## 2021-08-07 DIAGNOSIS — Z79899 Other long term (current) drug therapy: Secondary | ICD-10-CM | POA: Insufficient documentation

## 2021-08-07 DIAGNOSIS — Z800 Family history of malignant neoplasm of digestive organs: Secondary | ICD-10-CM | POA: Diagnosis not present

## 2021-08-07 DIAGNOSIS — M25473 Effusion, unspecified ankle: Secondary | ICD-10-CM | POA: Insufficient documentation

## 2021-08-07 DIAGNOSIS — M549 Dorsalgia, unspecified: Secondary | ICD-10-CM | POA: Insufficient documentation

## 2021-08-07 DIAGNOSIS — D508 Other iron deficiency anemias: Secondary | ICD-10-CM

## 2021-08-07 DIAGNOSIS — Z8546 Personal history of malignant neoplasm of prostate: Secondary | ICD-10-CM | POA: Diagnosis not present

## 2021-08-07 DIAGNOSIS — R5383 Other fatigue: Secondary | ICD-10-CM | POA: Insufficient documentation

## 2021-08-07 LAB — COMPREHENSIVE METABOLIC PANEL
ALT: 48 U/L — ABNORMAL HIGH (ref 0–44)
AST: 32 U/L (ref 15–41)
Albumin: 2.8 g/dL — ABNORMAL LOW (ref 3.5–5.0)
Alkaline Phosphatase: 398 U/L — ABNORMAL HIGH (ref 38–126)
Anion gap: 11 (ref 5–15)
BUN: 41 mg/dL — ABNORMAL HIGH (ref 8–23)
CO2: 26 mmol/L (ref 22–32)
Calcium: 11.1 mg/dL — ABNORMAL HIGH (ref 8.9–10.3)
Chloride: 95 mmol/L — ABNORMAL LOW (ref 98–111)
Creatinine, Ser: 1 mg/dL (ref 0.61–1.24)
GFR, Estimated: >60 mL/min (ref >60)
Glucose, Bld: 346 mg/dL — ABNORMAL HIGH (ref 70–99)
Potassium: 4.7 mmol/L (ref 3.5–5.1)
Sodium: 132 mmol/L — ABNORMAL LOW (ref 135–145)
Total Bilirubin: 1 mg/dL (ref 0.3–1.2)
Total Protein: 7 g/dL (ref 6.5–8.1)

## 2021-08-07 LAB — CBC WITH DIFFERENTIAL/PLATELET
Abs Immature Granulocytes: 0.05 10*3/uL (ref 0.00–0.07)
Basophils Absolute: 0 10*3/uL (ref 0.0–0.1)
Basophils Relative: 0 %
Eosinophils Absolute: 0 10*3/uL (ref 0.0–0.5)
Eosinophils Relative: 0 %
HCT: 36.4 % — ABNORMAL LOW (ref 39.0–52.0)
Hemoglobin: 11.5 g/dL — ABNORMAL LOW (ref 13.0–17.0)
Immature Granulocytes: 0 %
Lymphocytes Relative: 6 %
Lymphs Abs: 0.7 10*3/uL (ref 0.7–4.0)
MCH: 27.9 pg (ref 26.0–34.0)
MCHC: 31.6 g/dL (ref 30.0–36.0)
MCV: 88.3 fL (ref 80.0–100.0)
Monocytes Absolute: 0.7 10*3/uL (ref 0.1–1.0)
Monocytes Relative: 6 %
Neutro Abs: 10.1 10*3/uL — ABNORMAL HIGH (ref 1.7–7.7)
Neutrophils Relative %: 88 %
Platelets: 340 10*3/uL (ref 150–400)
RBC: 4.12 MIL/uL — ABNORMAL LOW (ref 4.22–5.81)
RDW: 15.1 % (ref 11.5–15.5)
WBC: 11.6 10*3/uL — ABNORMAL HIGH (ref 4.0–10.5)
nRBC: 0 % (ref 0.0–0.2)

## 2021-08-07 LAB — IRON AND TIBC
Iron: 28 ug/dL — ABNORMAL LOW (ref 45–182)
Saturation Ratios: 12 % — ABNORMAL LOW (ref 17.9–39.5)
TIBC: 226 ug/dL — ABNORMAL LOW (ref 250–450)
UIBC: 198 ug/dL

## 2021-08-07 LAB — LACTATE DEHYDROGENASE: LDH: 151 U/L (ref 98–192)

## 2021-08-07 LAB — RETICULOCYTES
Immature Retic Fract: 11.7 % (ref 2.3–15.9)
RBC.: 3.98 MIL/uL — ABNORMAL LOW (ref 4.22–5.81)
Retic Count, Absolute: 50.5 10*3/uL (ref 19.0–186.0)
Retic Ct Pct: 1.3 % (ref 0.4–3.1)

## 2021-08-07 LAB — VITAMIN B12: Vitamin B-12: 943 pg/mL — ABNORMAL HIGH (ref 180–914)

## 2021-08-07 LAB — FOLATE: Folate: 4.8 ng/mL — ABNORMAL LOW (ref >5.9)

## 2021-08-07 LAB — PSA: Prostatic Specific Antigen: 5.22 ng/mL — ABNORMAL HIGH (ref 0.00–4.00)

## 2021-08-07 LAB — FERRITIN: Ferritin: 401 ng/mL — ABNORMAL HIGH (ref 24–336)

## 2021-08-07 MED ORDER — FUROSEMIDE 20 MG PO TABS: 20.0000 mg | ORAL_TABLET | Freq: Every day | ORAL | 1 refills | Status: DC | PRN

## 2021-08-07 NOTE — Progress Notes (Signed)
[facility] [address], [place] [address]   CLINIC:  Medical Oncology/Hematology  CONSULT NOTE  Patient Care Team: Neale Burly, MD as PCP - General (Internal Medicine) Derek Jack, MD as Medical Oncologist (Medical Oncology) Brien Mates, RN as Oncology Nurse Navigator (Medical Oncology)  CHIEF COMPLAINTS/PURPOSE OF CONSULTATION:  Evaluation for pancreatic mass and multiple liver masses  HISTORY OF PRESENTING ILLNESS:  Mr. Jeffery Bryant 81 y.o. male is here because of evaluation for pancreatic mass and multiple liver masses, at the request of the ED.  Today he reports feeling fair, and he is accompanied by his wife and daughter. 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 He has lost 14 lbs since 07/25/21. He reports his weight in January 2023 was 140 lbs, and he has lost about 35 lbs in the past 2-3 months. He reports occasional vomiting starting 1 month ago, and he denies nausea. His appetite is poor. He eats 3 meals daily, but he is eating half of his normal portions. He is drinking a chocolate protein drink in addition to these meals; his daughter reports he tried Ensure but did not like it. He reports abdominal discomfort starting 1 month ago, but he denies abdominal pain. His feet have swollen starting yesterday. He reports weakness, and his wife reports he feel 2 weeks ago. This fall occurred while he was standing getting dressed, and he denies syncope; he was able to stand back up on his own following this fall. He reports his energy levels are half of his baseline, and his daughter reports he has been significantly more fatigued over the past 2 weeks. He reports he did not take valsartan or Norvasc today.   He currently works as a Pharmacist, community. He denies smoking history. His sister had colon cancer, and his father and mother had lung cancer, both with smoking history.   MEDICAL HISTORY:  Past Medical History:  Diagnosis Date    Diabetes mellitus without complication ([place])    Hypertension     SURGICAL HISTORY: History reviewed. No pertinent surgical history.  SOCIAL HISTORY: Social History   Socioeconomic History   Marital status: Married    Spouse name: Not on file   Number of children: Not on file   Years of education: Not on file   Highest education level: Not on file  Occupational History   Not on file  Tobacco Use   Smoking status: Never   Smokeless tobacco: Never  Vaping Use   Vaping Use: Never used  Substance and Sexual Activity   Alcohol use: Not Currently   Drug use: Not Currently   Sexual activity: Not on file  Other Topics Concern   Not on file  Social History Narrative   Not on file   Social Determinants of Health   Financial Resource Strain: Not on file  Food Insecurity: Not on file  Transportation Needs: Not on file  Physical Activity: Not on file  Stress: Not on file  Social Connections: Not on file  Intimate Partner Violence: Not on file    FAMILY HISTORY: History reviewed. No pertinent family history.  ALLERGIES:  is allergic to ciprofloxacin.  MEDICATIONS:  Current Outpatient Medications  Medication Sig Dispense Refill   acarbose (PRECOSE) 100 MG tablet Take 50 mg by mouth in the morning and at bedtime.     amLODipine (NORVASC) 5 MG tablet Take 5 mg by mouth daily.     atorvastatin (LIPITOR) 40 MG tablet Take  20 mg by mouth daily.     BYDUREON BCISE 2 MG/0.85ML AUIJ Inject 1 Syringe into the skin once a week.     furosemide (LASIX) 20 MG tablet Take 1 tablet (20 mg total) by mouth daily as needed. 30 tablet 1   JARDIANCE 10 MG TABS tablet Take 10 mg by mouth daily.     metFORMIN (GLUCOPHAGE) 1000 MG tablet Take 2,500 mg by mouth daily. 1 and 1/2 tablet in am, 1 tablet at night     ondansetron (ZOFRAN-ODT) 4 MG disintegrating tablet '4mg'$  ODT q4 hours prn nausea/vomit 12 tablet 0   valsartan-hydrochlorothiazide (DIOVAN-HCT) 320-25 MG tablet Take 1 tablet by mouth  daily.     No current facility-administered medications for this visit.    REVIEW OF SYSTEMS:   Review of Systems  Constitutional:  Positive for appetite change, fatigue (50%) and unexpected weight change (-14 lbs in 2 weeks).  Cardiovascular:  Positive for leg swelling (feet).  Gastrointestinal:  Positive for vomiting. Negative for abdominal pain and nausea.  Musculoskeletal:  Positive for back pain.  All other systems reviewed and are negative.    PHYSICAL EXAMINATION: ECOG PERFORMANCE STATUS: 1 - Symptomatic but completely ambulatory  Vitals:   08/07/21 0831  BP: 120/73  Pulse: 86  Resp: 18  Temp: (!) 96.4 F (35.8 C)  SpO2: 93%   Filed Weights   08/07/21 0831  Weight: 117 lb 4.8 oz (53.2 kg)   Physical Exam Vitals reviewed.  Constitutional:      Appearance: Normal appearance.  Cardiovascular:     Rate and Rhythm: Normal rate and regular rhythm.     Pulses: Normal pulses.     Heart sounds: Normal heart sounds.  Pulmonary:     Effort: Pulmonary effort is normal.     Breath sounds: Normal breath sounds.  Abdominal:     Palpations: Abdomen is soft. There is hepatomegaly (palpable 1 fingerbreadth below right costal margin). There is no splenomegaly or mass.     Tenderness: There is no abdominal tenderness.  Musculoskeletal:     Right lower leg: 2+ Edema present.     Left lower leg: 2+ Edema present.  Neurological:     General: No focal deficit present.     Mental Status: He is alert and oriented to person, place, and time.  Psychiatric:        Mood and Affect: Mood normal.        Behavior: Behavior normal.      LABORATORY DATA:  I have reviewed the data as listed    Latest Ref Rng & Units 08/07/2021    9:55 AM 07/25/2021    9:08 AM  CBC  WBC 4.0 - 10.5 K/uL 11.6  9.2   Hemoglobin 13.0 - 17.0 g/dL 11.5  10.6   Hematocrit 39.0 - 52.0 % 36.4  31.9   Platelets 150 - 400 K/uL 340  327       Latest Ref Rng & Units 08/07/2021    9:55 AM 07/25/2021    9:08  AM  CMP  Glucose 70 - 99 mg/dL 346  358   BUN 8 - 23 mg/dL 41  36   Creatinine 0.61 - 1.24 mg/dL 1.00  1.00   Sodium 135 - 145 mmol/L 132  133   Potassium 3.5 - 5.1 mmol/L 4.7  4.1   Chloride 98 - 111 mmol/L 95  98   CO2 22 - 32 mmol/L 26  24   Calcium 8.9 - 10.3 mg/dL  11.1  8.7   Total Protein 6.5 - 8.1 g/dL 7.0  6.2   Total Bilirubin 0.3 - 1.2 mg/dL 1.0  1.0   Alkaline Phos 38 - 126 U/L 398  257   AST 15 - 41 U/L 32  40   ALT 0 - 44 U/L 48  46     RADIOGRAPHIC STUDIES: I have personally reviewed the radiological images as listed and agreed with the findings in the report. CT ABDOMEN PELVIS W CONTRAST  Result Date: 07/25/2021 CLINICAL DATA:  Abdominal pain EXAM: CT ABDOMEN AND PELVIS WITH CONTRAST TECHNIQUE: Multidetector CT imaging of the abdomen and pelvis was performed using the standard protocol following bolus administration of intravenous contrast. RADIATION DOSE REDUCTION: This exam was performed according to the departmental dose-optimization program which includes automated exposure control, adjustment of the mA and/or kV according to patient size and/or use of iterative reconstruction technique. CONTRAST:  125m OMNIPAQUE IOHEXOL 300 MG/ML  SOLN COMPARISON:  None Available. FINDINGS: Lower chest: Coronary artery calcifications are seen. Visualized lower lung fields are clear of any focal infiltrates. Hepatobiliary: There are multiple space-occupying lesions of varying sizes measuring up to 5.4 cm. There is no dilation of bile ducts. Gallbladder stone is seen. Pancreas: There is prominence pancreatic duct. There is 4.3 x 2.4 cm low-density space-occupying lesion in the body of pancreas. Spleen: Unremarkable. Adrenals/Urinary Tract: Adrenals are unremarkable. There is no hydronephrosis. There are no renal or ureteral stones. Urinary bladder is distended. There is no wall thickening in the bladder. Stomach/Bowel: Stomach is unremarkable. Small bowel loops are not dilated. Appendix is not  dilated. There is no significant wall thickening in colon. There is no pericolic stranding. Vascular/Lymphatic: Scattered atherosclerotic plaques and calcifications are seen in the aorta and its major branches. Reproductive: There is marked enlargement of prostate projecting into the base of the bladder. Other: There is no ascites or pneumoperitoneum. Left inguinal hernia containing fat is seen. Musculoskeletal: No focal lytic or sclerotic lesions are seen. Degenerative changes are noted in the thoracic and lumbar spine. IMPRESSION: There is 4.3 cm low-density space-occupying lesion in the body of pancreas. There are multiple space-occupying lesions of varying sizes in the liver. 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 Follow-up PET-CT and tissue sampling should be considered. There is no evidence of intestinal obstruction or pneumoperitoneum. There is no hydronephrosis. Gallbladder stone. Marked enlargement of prostate. Coronary artery calcifications are seen. Other findings as described in the body of the report. Electronically Signed   By: PElmer PickerM.D.   On: 07/25/2021 11:42   DG Chest Port 1 View  Result Date: 07/25/2021 CLINICAL DATA:  Weakness and weight loss EXAM: PORTABLE CHEST 1 VIEW COMPARISON:  None FINDINGS: Artifact overlies the chest. Heart size is normal. Ordinary age related aortic atherosclerosis. The lungs are clear. No evidence of chronic lung disease. No infiltrate, collapse or effusion. No mass or nodule. No abnormal bone finding. IMPRESSION: No active disease. Electronically Signed   By: MNelson ChimesM.D.   On: 07/25/2021 09:43    ASSESSMENT:  Pancreatic mass and multiple liver lesions: - 14 pound weight loss since 07/25/2021. - Presentation to the ER with abdominal pain. - CTAP (07/25/2021): 4.3 x 2.4 cm low-density lesion in the body of the pancreas.  Multiple lesions in the liver measuring up to 5.4  cm. - CA 19-9: 14,984, CEA-310, PSA-5.22   Social/family history: - He lives at home with his wife.  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  Non-smoker. - Sister had colon cancer.  Father had lung cancer.  Mother had lung cancer.   PLAN:  Pancreatic mass and multiple liver masses: - We have reviewed images from the CT scan with the patient, his wife and his daughter. - Recommend ultrasound-guided liver biopsy of the lesions. - Recommend PET CT scan. - RTC after biopsy.  2.  Normocytic anemia: - Last hemoglobin was 10.6.  Will check ferritin, iron panel, V49 and folic acid.  3.  Hypertension: - He reportedly had a fall 2 weeks ago while getting dressed. - Blood pressure today is 120/73.  He has not taken any blood pressure medication.  Will discontinue Diovan HCT. - He will take Norvasc 5 mg tablet daily only if his SBP more than 130. - We will also discontinue atorvastatin.  4.  Ankle swelling: - 1 day duration.  Albumin is low at 2.6. - We will start him on Lasix 20 mg in the mornings as needed.   All questions were answered. The patient knows to call the clinic with any problems, questions or concerns.   Derek Jack, MD, 08/07/21 5:52 PM  [place] [phone]   I, Thana Ates, am acting as a scribe for Dr. Derek Jack.  I, Derek Jack MD, have reviewed the above documentation for accuracy and completeness, and I agree with the above.

## 2021-08-07 NOTE — Patient Instructions (Addendum)
[place] at [facility] Discharge Instructions  You were seen and examined today by Dr. Delton Coombes. Dr. Delton Coombes is a medical oncologist, meaning that he specializes in the treatment of cancer diagnoses. Dr. Delton Coombes discussed your past medical history, family history of cancers, and the events that led to you being here today.  You were referred to Dr. Delton Coombes due to an abnormal CT scan that revealed a pancreatic mass as well as multiple liver lesions. This is very concerning for pancreatic cancer. This will need to be confirmed with a biopsy.  To identify the stage of the cancer, Dr. Delton Coombes has recommended a PET scan. A PET scan is a specialized CT scan that illuminates where there is cancer present in the body.  Based on the CT scan, it appears to be a cancer that started in the pancreas and has spread to the liver.  You were recently anemic, Dr. Delton Coombes has recommended additional labs today to evaluate further.   Dr. Delton Coombes has recommended stopping both blood pressure medications. Valsartan-hydrochlorthiazide and Amlodipine (Norvasc). Monitor your blood pressure, if the top number is over 130, please take the Amlodipine (Norvasc). Also stop Lipitor. For the swelling, take Lasix in the morning. If you do not have swelling, you do not need the medication.  Follow-up with Dr. Delton Coombes approximately one week after the biopsy. Dr. Delton Coombes has reached out to the department that does biopsies to ask for an earlier date.  Thank you for choosing [place] at [facility] to provide your oncology and hematology care.  To afford each patient quality time with our provider, please arrive at least 15 minutes before your scheduled appointment time.   If you have a lab appointment with the [facility] please come in thru the Main Entrance and check in at the main information desk.  You need to re-schedule your appointment should you  arrive 10 or more minutes late.  We strive to give you quality time with our providers, and arriving late affects you and other patients whose appointments are after yours.  Also, if you no show three or more times for appointments you may be dismissed from the clinic at the providers discretion.     Again, thank you for choosing [facility].  Our hope is that these requests will decrease the amount of time that you wait before being seen by our physicians.       _____________________________________________________________  Should you have questions after your visit to [facility], please contact our office at [phone] and follow the prompts.  Our office hours are 8:00 a.m. and 4:30 p.m. Monday - Friday.  Please note that voicemails left after 4:00 p.m. may not be returned until the following business day.  We are closed weekends and major holidays.  You do have access to a nurse 24-7, just call the main number to the clinic [phone] and do not press any options, hold on the line and a nurse will answer the phone.    For prescription refill requests, have your pharmacy contact our office and allow 72 hours.

## 2021-08-08 ENCOUNTER — Encounter (HOSPITAL_COMMUNITY): Payer: Self-pay

## 2021-08-08 ENCOUNTER — Ambulatory Visit (HOSPITAL_COMMUNITY)
Admission: RE | Admit: 2021-08-08 | Discharge: 2021-08-08 | Disposition: A | Discharge status: Home / Self Care | Payer: Medicare Other | Source: Ambulatory Visit | Attending: Hematology | Admitting: Hematology

## 2021-08-08 DIAGNOSIS — C259 Malignant neoplasm of pancreas, unspecified: Secondary | ICD-10-CM | POA: Insufficient documentation

## 2021-08-08 DIAGNOSIS — K8689 Other specified diseases of pancreas: Secondary | ICD-10-CM | POA: Insufficient documentation

## 2021-08-08 DIAGNOSIS — R160 Hepatomegaly, not elsewhere classified: Secondary | ICD-10-CM | POA: Insufficient documentation

## 2021-08-08 NOTE — Progress Notes (Signed)
I met with the patient and his family before, during and following initial visit with Dr. Delton Coombes. I introduced myself and explained my role in the patient's care. I provided my contact information and encouraged the patient and family to call with questions or concerns.

## 2021-08-09 ENCOUNTER — Other Ambulatory Visit: Payer: Self-pay | Admitting: Student

## 2021-08-09 DIAGNOSIS — R160 Hepatomegaly, not elsewhere classified: Secondary | ICD-10-CM

## 2021-08-11 LAB — COPPER, SERUM: Copper: 96 ug/dL (ref 69–132)

## 2021-08-12 ENCOUNTER — Ambulatory Visit (HOSPITAL_COMMUNITY)
Admission: RE | Admit: 2021-08-12 | Discharge: 2021-08-12 | Disposition: A | Discharge status: Home / Self Care | Payer: Medicare Other | Source: Ambulatory Visit | Attending: Hematology | Admitting: Hematology

## 2021-08-12 ENCOUNTER — Other Ambulatory Visit: Payer: Self-pay

## 2021-08-12 ENCOUNTER — Encounter (HOSPITAL_COMMUNITY): Payer: Self-pay

## 2021-08-12 ENCOUNTER — Inpatient Hospital Stay (HOSPITAL_COMMUNITY)
Admission: EM | Admit: 2021-08-12 | Discharge: 2021-08-16 | DRG: 641 | Disposition: A | Discharge status: Hospice | Payer: Medicare Other | Source: Ambulatory Visit | Attending: Internal Medicine | Admitting: Internal Medicine

## 2021-08-12 VITALS — BP 108/64 | HR 91 | Temp 97.4°F

## 2021-08-12 DIAGNOSIS — N179 Acute kidney failure, unspecified: Secondary | ICD-10-CM | POA: Diagnosis present

## 2021-08-12 DIAGNOSIS — K7689 Other specified diseases of liver: Secondary | ICD-10-CM | POA: Diagnosis not present

## 2021-08-12 DIAGNOSIS — I10 Essential (primary) hypertension: Secondary | ICD-10-CM | POA: Diagnosis not present

## 2021-08-12 DIAGNOSIS — R296 Repeated falls: Secondary | ICD-10-CM | POA: Diagnosis present

## 2021-08-12 DIAGNOSIS — Z7984 Long term (current) use of oral hypoglycemic drugs: Secondary | ICD-10-CM | POA: Insufficient documentation

## 2021-08-12 DIAGNOSIS — Z8546 Personal history of malignant neoplasm of prostate: Secondary | ICD-10-CM

## 2021-08-12 DIAGNOSIS — R531 Weakness: Secondary | ICD-10-CM | POA: Diagnosis not present

## 2021-08-12 DIAGNOSIS — C229 Malignant neoplasm of liver, not specified as primary or secondary: Secondary | ICD-10-CM | POA: Diagnosis not present

## 2021-08-12 DIAGNOSIS — Z881 Allergy status to other antibiotic agents status: Secondary | ICD-10-CM

## 2021-08-12 DIAGNOSIS — K769 Liver disease, unspecified: Secondary | ICD-10-CM | POA: Insufficient documentation

## 2021-08-12 DIAGNOSIS — Z7401 Bed confinement status: Secondary | ICD-10-CM | POA: Diagnosis not present

## 2021-08-12 DIAGNOSIS — C787 Secondary malignant neoplasm of liver and intrahepatic bile duct: Secondary | ICD-10-CM | POA: Diagnosis not present

## 2021-08-12 DIAGNOSIS — D72829 Elevated white blood cell count, unspecified: Secondary | ICD-10-CM | POA: Diagnosis present

## 2021-08-12 DIAGNOSIS — D509 Iron deficiency anemia, unspecified: Secondary | ICD-10-CM | POA: Diagnosis not present

## 2021-08-12 DIAGNOSIS — R54 Age-related physical debility: Secondary | ICD-10-CM | POA: Diagnosis present

## 2021-08-12 DIAGNOSIS — C251 Malignant neoplasm of body of pancreas: Secondary | ICD-10-CM | POA: Diagnosis present

## 2021-08-12 DIAGNOSIS — C801 Malignant (primary) neoplasm, unspecified: Secondary | ICD-10-CM | POA: Diagnosis not present

## 2021-08-12 DIAGNOSIS — K8020 Calculus of gallbladder without cholecystitis without obstruction: Secondary | ICD-10-CM | POA: Diagnosis not present

## 2021-08-12 DIAGNOSIS — Z681 Body mass index (BMI) 19 or less, adult: Secondary | ICD-10-CM | POA: Diagnosis not present

## 2021-08-12 DIAGNOSIS — K8689 Other specified diseases of pancreas: Secondary | ICD-10-CM

## 2021-08-12 DIAGNOSIS — E1165 Type 2 diabetes mellitus with hyperglycemia: Secondary | ICD-10-CM | POA: Diagnosis not present

## 2021-08-12 DIAGNOSIS — Z515 Encounter for palliative care: Secondary | ICD-10-CM | POA: Diagnosis not present

## 2021-08-12 DIAGNOSIS — R32 Unspecified urinary incontinence: Secondary | ICD-10-CM | POA: Diagnosis not present

## 2021-08-12 DIAGNOSIS — E860 Dehydration: Principal | ICD-10-CM | POA: Diagnosis present

## 2021-08-12 DIAGNOSIS — Z7189 Other specified counseling: Secondary | ICD-10-CM | POA: Diagnosis not present

## 2021-08-12 DIAGNOSIS — R739 Hyperglycemia, unspecified: Secondary | ICD-10-CM

## 2021-08-12 DIAGNOSIS — Z79899 Other long term (current) drug therapy: Secondary | ICD-10-CM

## 2021-08-12 DIAGNOSIS — R1310 Dysphagia, unspecified: Secondary | ICD-10-CM | POA: Diagnosis not present

## 2021-08-12 DIAGNOSIS — K869 Disease of pancreas, unspecified: Secondary | ICD-10-CM | POA: Insufficient documentation

## 2021-08-12 DIAGNOSIS — R160 Hepatomegaly, not elsewhere classified: Secondary | ICD-10-CM | POA: Diagnosis present

## 2021-08-12 DIAGNOSIS — E119 Type 2 diabetes mellitus without complications: Secondary | ICD-10-CM

## 2021-08-12 DIAGNOSIS — Z538 Procedure and treatment not carried out for other reasons: Secondary | ICD-10-CM | POA: Insufficient documentation

## 2021-08-12 DIAGNOSIS — R159 Full incontinence of feces: Secondary | ICD-10-CM | POA: Diagnosis not present

## 2021-08-12 DIAGNOSIS — R64 Cachexia: Secondary | ICD-10-CM | POA: Diagnosis present

## 2021-08-12 DIAGNOSIS — Z66 Do not resuscitate: Secondary | ICD-10-CM | POA: Diagnosis present

## 2021-08-12 DIAGNOSIS — C259 Malignant neoplasm of pancreas, unspecified: Secondary | ICD-10-CM | POA: Diagnosis not present

## 2021-08-12 LAB — URINALYSIS, ROUTINE W REFLEX MICROSCOPIC
Bacteria, UA: NONE SEEN
Bilirubin Urine: NEGATIVE
Glucose, UA: >=500 mg/dL — AB
Hgb urine dipstick: NEGATIVE
Ketones, ur: 20 mg/dL — AB
Leukocytes,Ua: NEGATIVE
Nitrite: NEGATIVE
Protein, ur: NEGATIVE mg/dL
Specific Gravity, Urine: 1.014 (ref 1.005–1.030)
pH: 5 (ref 5.0–8.0)

## 2021-08-12 LAB — CBC WITH DIFFERENTIAL/PLATELET
Abs Immature Granulocytes: 0.09 10*3/uL — ABNORMAL HIGH (ref 0.00–0.07)
Basophils Absolute: 0 10*3/uL (ref 0.0–0.1)
Basophils Relative: 0 %
Eosinophils Absolute: 0 10*3/uL (ref 0.0–0.5)
Eosinophils Relative: 0 %
HCT: 36.7 % — ABNORMAL LOW (ref 39.0–52.0)
Hemoglobin: 11.6 g/dL — ABNORMAL LOW (ref 13.0–17.0)
Immature Granulocytes: 1 %
Lymphocytes Relative: 3 %
Lymphs Abs: 0.4 10*3/uL — ABNORMAL LOW (ref 0.7–4.0)
MCH: 28.2 pg (ref 26.0–34.0)
MCHC: 31.6 g/dL (ref 30.0–36.0)
MCV: 89.3 fL (ref 80.0–100.0)
Monocytes Absolute: 0.4 10*3/uL (ref 0.1–1.0)
Monocytes Relative: 3 %
Neutro Abs: 13.2 10*3/uL — ABNORMAL HIGH (ref 1.7–7.7)
Neutrophils Relative %: 93 %
Platelets: 368 10*3/uL (ref 150–400)
RBC: 4.11 MIL/uL — ABNORMAL LOW (ref 4.22–5.81)
RDW: 15.6 % — ABNORMAL HIGH (ref 11.5–15.5)
WBC: 14.1 10*3/uL — ABNORMAL HIGH (ref 4.0–10.5)
nRBC: 0 % (ref 0.0–0.2)

## 2021-08-12 LAB — CBC
HCT: 36.6 % — ABNORMAL LOW (ref 39.0–52.0)
HCT: 32.8 % — ABNORMAL LOW (ref 39.0–52.0)
Hemoglobin: 11.9 g/dL — ABNORMAL LOW (ref 13.0–17.0)
Hemoglobin: 10.5 g/dL — ABNORMAL LOW (ref 13.0–17.0)
MCH: 28.9 pg (ref 26.0–34.0)
MCH: 28.2 pg (ref 26.0–34.0)
MCHC: 32.5 g/dL (ref 30.0–36.0)
MCHC: 32 g/dL (ref 30.0–36.0)
MCV: 88.8 fL (ref 80.0–100.0)
MCV: 88.2 fL (ref 80.0–100.0)
Platelets: 375 10*3/uL (ref 150–400)
Platelets: 306 10*3/uL (ref 150–400)
RBC: 4.12 MIL/uL — ABNORMAL LOW (ref 4.22–5.81)
RBC: 3.72 MIL/uL — ABNORMAL LOW (ref 4.22–5.81)
RDW: 15.6 % — ABNORMAL HIGH (ref 11.5–15.5)
RDW: 15.8 % — ABNORMAL HIGH (ref 11.5–15.5)
WBC: 14.6 10*3/uL — ABNORMAL HIGH (ref 4.0–10.5)
WBC: 12.2 10*3/uL — ABNORMAL HIGH (ref 4.0–10.5)
nRBC: 0 % (ref 0.0–0.2)
nRBC: 0 % (ref 0.0–0.2)

## 2021-08-12 LAB — HEMOGLOBIN A1C
Hgb A1c MFr Bld: 11.1 % — ABNORMAL HIGH (ref 4.8–5.6)
Mean Plasma Glucose: 271.87 mg/dL

## 2021-08-12 LAB — LIPASE, BLOOD: Lipase: 27 U/L (ref 11–51)

## 2021-08-12 LAB — CBG MONITORING, ED
Glucose-Capillary: 432 mg/dL — ABNORMAL HIGH (ref 70–99)
Glucose-Capillary: 329 mg/dL — ABNORMAL HIGH (ref 70–99)
Glucose-Capillary: 278 mg/dL — ABNORMAL HIGH (ref 70–99)

## 2021-08-12 LAB — COMPREHENSIVE METABOLIC PANEL
ALT: 40 U/L (ref 0–44)
AST: 26 U/L (ref 15–41)
Albumin: 2.5 g/dL — ABNORMAL LOW (ref 3.5–5.0)
Alkaline Phosphatase: 328 U/L — ABNORMAL HIGH (ref 38–126)
Anion gap: 15 (ref 5–15)
BUN: 69 mg/dL — ABNORMAL HIGH (ref 8–23)
CO2: 23 mmol/L (ref 22–32)
Calcium: 11.8 mg/dL — ABNORMAL HIGH (ref 8.9–10.3)
Chloride: 98 mmol/L (ref 98–111)
Creatinine, Ser: 1.17 mg/dL (ref 0.61–1.24)
GFR, Estimated: >60 mL/min (ref >60)
Glucose, Bld: 403 mg/dL — ABNORMAL HIGH (ref 70–99)
Potassium: 3.9 mmol/L (ref 3.5–5.1)
Sodium: 136 mmol/L (ref 135–145)
Total Bilirubin: 1.4 mg/dL — ABNORMAL HIGH (ref 0.3–1.2)
Total Protein: 6.5 g/dL (ref 6.5–8.1)

## 2021-08-12 LAB — AMMONIA: Ammonia: 10 umol/L (ref 9–35)

## 2021-08-12 LAB — GLUCOSE, CAPILLARY
Glucose-Capillary: 505 mg/dL (ref 70–99)
Glucose-Capillary: 476 mg/dL — ABNORMAL HIGH (ref 70–99)

## 2021-08-12 LAB — CK: Total CK: 14 U/L — ABNORMAL LOW (ref 49–397)

## 2021-08-12 LAB — PROTIME-INR
INR: 1 (ref 0.8–1.2)
Prothrombin Time: 13.4 seconds (ref 11.4–15.2)

## 2021-08-12 LAB — LACTIC ACID, PLASMA: Lactic Acid, Venous: 1.7 mmol/L (ref 0.5–1.9)

## 2021-08-12 LAB — CREATININE, SERUM
Creatinine, Ser: 0.95 mg/dL (ref 0.61–1.24)
GFR, Estimated: >60 mL/min (ref >60)

## 2021-08-12 MED ORDER — ENOXAPARIN SODIUM 30 MG/0.3ML IJ SOSY
30.0000 mg | PREFILLED_SYRINGE | INTRAMUSCULAR | Status: DC
  Administered 2021-08-12 – 2021-08-13 (×2): 30 mg via SUBCUTANEOUS
  Filled 2021-08-12 (×3): qty 0.3

## 2021-08-12 MED ORDER — ONDANSETRON HCL 4 MG/2ML IJ SOLN
4.0000 mg | Freq: Four times a day (QID) | INTRAMUSCULAR | Status: DC | PRN
Start: 2021-08-12 — End: 2021-08-16

## 2021-08-12 MED ORDER — SODIUM CHLORIDE 0.9 % IV BOLUS
1000.0000 mL | Freq: Once | INTRAVENOUS | Status: AC
  Administered 2021-08-12: 1000 mL via INTRAVENOUS

## 2021-08-12 MED ORDER — ENSURE ENLIVE PO LIQD
237.0000 mL | Freq: Two times a day (BID) | ORAL | Status: DC
  Administered 2021-08-13 – 2021-08-16 (×5): 237 mL via ORAL

## 2021-08-12 MED ORDER — INSULIN ASPART 100 UNIT/ML IJ SOLN
0.0000 [IU] | Freq: Three times a day (TID) | INTRAMUSCULAR | Status: DC
  Administered 2021-08-12: 5 [IU] via SUBCUTANEOUS
  Administered 2021-08-13: 9 [IU] via SUBCUTANEOUS
  Administered 2021-08-13: 5 [IU] via SUBCUTANEOUS
  Administered 2021-08-13: 7 [IU] via SUBCUTANEOUS
  Administered 2021-08-14: 3 [IU] via SUBCUTANEOUS
  Administered 2021-08-14: 2 [IU] via SUBCUTANEOUS
  Administered 2021-08-15: 3 [IU] via SUBCUTANEOUS
  Administered 2021-08-16: 7 [IU] via SUBCUTANEOUS
  Administered 2021-08-16: 3 [IU] via SUBCUTANEOUS
  Filled 2021-08-12: qty 0.09

## 2021-08-12 MED ORDER — INSULIN ASPART 100 UNIT/ML IJ SOLN
12.0000 [IU] | Freq: Once | INTRAMUSCULAR | Status: AC
  Administered 2021-08-12: 12 [IU] via SUBCUTANEOUS
  Filled 2021-08-12: qty 1

## 2021-08-12 MED ORDER — SODIUM CHLORIDE 0.9 % IV SOLN: INTRAVENOUS | Status: DC

## 2021-08-12 NOTE — ED Provider Notes (Signed)
[facility] DEPT Provider Note   CSN: [id number] Arrival date & time: 08/12/21  1234     History  Chief Complaint  Patient presents with   Hyperglycemia    Jeffery Bryant is a 81 y.o. male.  HPI Patient presents with his daughter and his wife.  He presents with weakness, anorexia, and hyperglycemia.  Details are obtained by the patient's family members as he cannot speak substantially due to weakness.  History is most notable for evaluation 1 month ago for presented with abdominal pain during which she was found to have pancreatic mass with possible liver masses.  With presumption of metastatic pancreatic cancer he was scheduled for PET scan, and today was here for liver biopsy.  However, in preparing for the biopsy was found to be hyperglycemic, and was sent to the emergency department for evaluation.  Family notes the patient has had substantial decline over the past days, has been eating and drinking substantially less.  No reported fever, fall, or overt confusion.  Patient also has a history of prostate cancer in the distant past, not surgically managed.    Home Medications Prior to Admission medications   Medication Sig Start Date End Date Taking? Authorizing Provider  acarbose (PRECOSE) 100 MG tablet Take 50 mg by mouth in the morning and at bedtime. 07/08/21  Yes [provider]  BYDUREON BCISE 2 MG/0.85ML AUIJ Inject 1 Syringe into the skin once a week. 06/26/21  Yes [provider]  furosemide (LASIX) 20 MG tablet Take 1 tablet (20 mg total) by mouth daily as needed. Patient taking differently: Take 20 mg by mouth daily as needed for fluid or edema. 08/07/21  Yes Derek Jack, MD  JARDIANCE 10 MG TABS tablet Take 10 mg by mouth daily. 07/08/21  Yes [provider]  metFORMIN (GLUCOPHAGE) 1000 MG tablet Take 1,000-1,500 mg by mouth 2 (two) times daily with a meal. Take 1.5 tablets (1500 mg) in the morning and Take 1  tablet (1000 mg) at bedtime 07/08/21  Yes [provider]  amLODipine (NORVASC) 5 MG tablet Take 5 mg by mouth as directed. If BP>130 Take 1 tablet Daily If Not "HOLD MEDICATION* 07/16/21   [provider]  ondansetron (ZOFRAN-ODT) 4 MG disintegrating tablet '4mg'$  ODT q4 hours prn nausea/vomit Patient taking differently: Take 4 mg by mouth every 4 (four) hours as needed for nausea or vomiting. 07/25/21   Milton Ferguson, MD  valsartan-hydrochlorothiazide (DIOVAN-HCT) 320-25 MG tablet Take 1 tablet by mouth as directed. If BP>130 Take 1 tablet Daily If Not "HOLD MEDICATION* 07/08/21   [provider]      Allergies    Ciprofloxacin    Review of Systems   Review of Systems  Unable to perform ROS: Mental status change    Physical Exam Updated Vital Signs BP 101/60   Pulse 72   Temp (!) 97.5 F (36.4 C) (Oral)   Resp 20   SpO2 100%  Physical Exam Vitals and nursing note reviewed.  Constitutional:      General: He is not in acute distress.    Appearance: He is well-developed.     Comments: Cachectic appearing elderly male speaking minimally, with a very faint voice.  HENT:     Head: Normocephalic and atraumatic.  Eyes:     Conjunctiva/sclera: Conjunctivae normal.  Cardiovascular:     Rate and Rhythm: Normal rate and regular rhythm.  Pulmonary:     Effort: Pulmonary effort is normal. No respiratory distress.  Breath sounds: No stridor.  Abdominal:     General: There is no distension.  Musculoskeletal:        General: No deformity.  Skin:    General: Skin is warm and dry.     Coloration: Skin is pale.  Neurological:     Mental Status: He is oriented to person, place, and time.     Cranial Nerves: No cranial nerve deficit.     Motor: Atrophy and abnormal muscle tone present.     Coordination: Coordination normal.  Psychiatric:     Comments: Withdrawn     ED Results / Procedures / Treatments   Labs (all labs ordered are listed, but only abnormal  results are displayed) Labs Reviewed  COMPREHENSIVE METABOLIC PANEL - Abnormal; Notable for the following components:      Result Value   Glucose, Bld 403 (*)    BUN 69 (*)    Calcium 11.8 (*)    Albumin 2.5 (*)    Alkaline Phosphatase 328 (*)    Total Bilirubin 1.4 (*)    All other components within normal limits  CBC WITH DIFFERENTIAL/PLATELET - Abnormal; Notable for the following components:   WBC 14.1 (*)    RBC 4.11 (*)    Hemoglobin 11.6 (*)    HCT 36.7 (*)    RDW 15.6 (*)    Neutro Abs 13.2 (*)    Lymphs Abs 0.4 (*)    Abs Immature Granulocytes 0.09 (*)    All other components within normal limits  CK - Abnormal; Notable for the following components:   Total CK 14 (*)    All other components within normal limits  CBG MONITORING, ED - Abnormal; Notable for the following components:   Glucose-Capillary 432 (*)    All other components within normal limits  CBG MONITORING, ED - Abnormal; Notable for the following components:   Glucose-Capillary 329 (*)    All other components within normal limits  LACTIC ACID, PLASMA  LIPASE, BLOOD  AMMONIA  LACTIC ACID, PLASMA    EKG None  Radiology No results found.  Procedures Procedures    Medications Ordered in ED Medications  sodium chloride 0.9 % bolus 1,000 mL (0 mLs Intravenous Stopped 08/12/21 1448)  sodium chloride 0.9 % bolus 1,000 mL (1,000 mLs Intravenous New Bag/Given 08/12/21 1448)    ED Course/ Medical Decision Making/ A&P  This patient with a Hx of prostate cancer, recently elucidated pancreatic and liver masses presents to the ED for concern of hyperglycemia, weakness, anorexia, this involves an extensive number of treatment options, and is a complaint that carries with it a high risk of complications and morbidity.    The differential diagnosis includes progression of disease, DKA, nonketotic hyperosmolar state, dehydration, malnutrition, infection   Social Determinants of Health:  Age  Additional  history obtained:  Additional history and/or information obtained from family members at bedside and chart, notable for family members for HPI included above, chart review notable for imaging studies performed within the past month with demonstration of pancreatic lesion, possible hepatic metastases   After the initial evaluation, orders, including: Fluids, labs   Patient placed on Cardiac and Pulse-Oximetry Monitors. The patient was maintained on a cardiac monitor.  The cardiac monitored showed an rhythm of 70 sinus normal The patient was also maintained on pulse oximetry. The readings were typically 99% room air normal   On repeat evaluation of the patient stayed the same  Lab Tests:  I personally interpreted labs.  The  pertinent results include: Hyperglycemia  Imaging Studies ordered:  I independently visualized and interpreted imaging which showed imaging from last month reviewed, multiple liver abnormalities noticeable I agree with the radiologist interpretation  Consultations Obtained:  I requested consultation with the palliative care,  and discussed lab and imaging findings as well as pertinent plan - they recommend: They will follow as a consulting team  Dispostion / Final MDM:  After consideration of the diagnostic results and the patient's response to treatment, this adult male with recent CT consistent with pancreatic malignancy, possible hepatic metastases in the context of long history of prostate cancer now presents with weakness, fatigue, anorexia, is found have notable hyperglycemia, concern for progression of disease, more than DKA or nonketotic hyperosmolar state.  Patient received fluid resuscitation, had decreasing glucose in the emergency department, but given his substantial decline recently, I discussed this case with our palliative team and they will follow as a consulting service.  In addition, oncology is aware, will follow as a team in the ED should the  patient strengthen enough to make biopsy a feasible procedure.  Patient is a DNR, but family would like to allow for opportunity for strengthening sufficiently to consider additional diagnostic tests for definitive diagnosis.    Final Clinical Impression(s) / ED Diagnoses Final diagnoses:  Hyperglycemia  Weakness     Carmin Muskrat, MD 08/12/21 1635

## 2021-08-12 NOTE — ED Notes (Signed)
Patient repositioned. Provided with sprite and water. He is awake and alert visiting with family at bedside at this time.

## 2021-08-12 NOTE — H&P (Signed)
Chief Complaint: Patient was seen in consultation today for liver mass biopsy   Referring Physician(s): Katragadda,Sreedhar  Supervising Physician: Ruthann Cancer  Patient Status: [facility] - Out-pt  History of Present Illness: Jeffery Bryant is a 81 y.o. male with recent findings of pancreatic mass with liver lesions concerning for metastatic process. He is referred for image guided biopsy of liver lesion.  Family at bedside and state pt overall condition has rapidly declined in past week or so. Very lethargic, not eating, can't get himself out of bed or move around. Unable to take his meds including those for his DM.  CBG upon arrival to Short Stay is 505.   Past Medical History:  Diagnosis Date   Diabetes mellitus without complication ([place])    Hypertension     No past surgical history on file.  Allergies: Ciprofloxacin  Medications: Prior to Admission medications   Medication Sig Start Date End Date Taking? Authorizing Provider  acarbose (PRECOSE) 100 MG tablet Take 50 mg by mouth in the morning and at bedtime. 07/08/21   [provider]  amLODipine (NORVASC) 5 MG tablet Take 5 mg by mouth daily. 07/16/21   [provider]  atorvastatin (LIPITOR) 40 MG tablet Take 20 mg by mouth daily. 05/28/21   [provider]  BYDUREON BCISE 2 MG/0.85ML AUIJ Inject 1 Syringe into the skin once a week. 06/26/21   [provider]  furosemide (LASIX) 20 MG tablet Take 1 tablet (20 mg total) by mouth daily as needed. 08/07/21   Derek Jack, MD  JARDIANCE 10 MG TABS tablet Take 10 mg by mouth daily. 07/08/21   [provider]  metFORMIN (GLUCOPHAGE) 1000 MG tablet Take 2,500 mg by mouth daily. 1 and 1/2 tablet in am, 1 tablet at night 07/08/21   [provider]  ondansetron (ZOFRAN-ODT) 4 MG disintegrating tablet '4mg'$  ODT q4 hours prn nausea/vomit 07/25/21   Milton Ferguson, MD  valsartan-hydrochlorothiazide (DIOVAN-HCT) 320-25 MG tablet  Take 1 tablet by mouth daily. 07/08/21   [provider]     No family history on file.  Social History   Socioeconomic History   Marital status: Married    Spouse name: Not on file   Number of children: Not on file   Years of education: Not on file   Highest education level: Not on file  Occupational History   Not on file  Tobacco Use   Smoking status: Never   Smokeless tobacco: Never  Vaping Use   Vaping Use: Never used  Substance and Sexual Activity   Alcohol use: Not Currently   Drug use: Not Currently   Sexual activity: Not on file  Other Topics Concern   Not on file  Social History Narrative   Not on file   Social Determinants of Health   Financial Resource Strain: Not on file  Food Insecurity: Not on file  Transportation Needs: Not on file  Physical Activity: Not on file  Stress: Not on file  Social Connections: Not on file    Review of Systems: A 12 point ROS discussed and pertinent positives are indicated in the HPI above.  All other systems are negative.  Review of Systems  Vital Signs: BP 108/64 (BP Location: Right Arm)   Pulse 91   Temp (!) 97.4 F (36.3 C) (Oral)   SpO2 99%     Imaging: CT ABDOMEN PELVIS W CONTRAST  Result Date: 07/25/2021 CLINICAL DATA:  Abdominal pain EXAM: CT ABDOMEN AND PELVIS WITH CONTRAST TECHNIQUE:  Multidetector CT imaging of the abdomen and pelvis was performed using the standard protocol following bolus administration of intravenous contrast. RADIATION DOSE REDUCTION: This exam was performed according to the departmental dose-optimization program which includes automated exposure control, adjustment of the mA and/or kV according to patient size and/or use of iterative reconstruction technique. CONTRAST:  128m OMNIPAQUE IOHEXOL 300 MG/ML  SOLN COMPARISON:  None Available. FINDINGS: Lower chest: Coronary artery calcifications are seen. Visualized lower lung fields are clear of any focal infiltrates. Hepatobiliary:  There are multiple space-occupying lesions of varying sizes measuring up to 5.4 cm. There is no dilation of bile ducts. Gallbladder stone is seen. Pancreas: There is prominence pancreatic duct. There is 4.3 x 2.4 cm low-density space-occupying lesion in the body of pancreas. Spleen: Unremarkable. Adrenals/Urinary Tract: Adrenals are unremarkable. There is no hydronephrosis. There are no renal or ureteral stones. Urinary bladder is distended. There is no wall thickening in the bladder. Stomach/Bowel: Stomach is unremarkable. Small bowel loops are not dilated. Appendix is not dilated. There is no significant wall thickening in colon. There is no pericolic stranding. Vascular/Lymphatic: Scattered atherosclerotic plaques and calcifications are seen in the aorta and its major branches. Reproductive: There is marked enlargement of prostate projecting into the base of the bladder. Other: There is no ascites or pneumoperitoneum. Left inguinal hernia containing fat is seen. Musculoskeletal: No focal lytic or sclerotic lesions are seen. Degenerative changes are noted in the thoracic and lumbar spine. IMPRESSION: There is 4.3 cm low-density space-occupying lesion in the body of pancreas. There are multiple space-occupying lesions of varying sizes in the liver. 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 Follow-up PET-CT and tissue sampling should be considered. There is no evidence of intestinal obstruction or pneumoperitoneum. There is no hydronephrosis. Gallbladder stone. Marked enlargement of prostate. Coronary artery calcifications are seen. Other findings as described in the body of the report. Electronically Signed   By: PElmer PickerM.D.   On: 07/25/2021 11:42   DG Chest Port 1 View  Result Date: 07/25/2021 CLINICAL DATA:  Weakness and weight loss EXAM: PORTABLE CHEST 1 VIEW COMPARISON:  None FINDINGS: Artifact overlies the chest. Heart size  is normal. Ordinary age related aortic atherosclerosis. The lungs are clear. No evidence of chronic lung disease. No infiltrate, collapse or effusion. No mass or nodule. No abnormal bone finding. IMPRESSION: No active disease. Electronically Signed   By: MNelson ChimesM.D.   On: 07/25/2021 09:43    Labs:  CBC: Recent Labs    07/25/21 0908 08/07/21 0955  WBC 9.2 11.6*  HGB 10.6* 11.5*  HCT 31.9* 36.4*  PLT 327 340    COAGS: No results for input(s): "INR", "APTT" in the last 8760 hours.  BMP: Recent Labs    07/25/21 0908 08/07/21 0955  NA 133* 132*  K 4.1 4.7  CL 98 95*  CO2 24 26  GLUCOSE 358* 346*  BUN 36* 41*  CALCIUM 8.7* 11.1*  CREATININE 1.00 1.00  GFRNONAA >60 >60    LIVER FUNCTION TESTS: Recent Labs    07/25/21 0908 08/07/21 0955  BILITOT 1.0 1.0  AST 40 32  ALT 46* 48*  ALKPHOS 257* 398*  PROT 6.2* 7.0  ALBUMIN 2.6* 2.8*     Assessment and Plan: Pancreatic mass with numerous liver lesions Hyperglycemia, uncontrolled DM CBG only down to 476 after 12 units Novolog. Given overall condition, do not feel liver biopsy with moderate sedation would be safe at this time.  Feel pt needs medical assessment and possible admission. Long discussion with family. Bx can still performed at later date/time if more stable. However, pt wife states pt may not wish to even pursue treatment. May need oncology and palliative input as well.  Will take pt to ER for further assessment.   Electronically Signed: Ascencion Dike, PA-C 08/12/2021, 10:57 AM

## 2021-08-12 NOTE — H&P (Signed)
History and Physical    Jeffery Bryant VOZ:[id number] DOB: 1940/07/28 DOA: 08/12/2021  PCP: Neale Burly, MD Patient coming from: Home  Chief Complaint: Weakness  HPI: Jeffery Bryant is a 81 y.o. male with medical history significant of pancreatic mass with liver lesions came to interventional radiology for biopsy today.  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  His appetite has been poor.  He has no nausea vomiting.  He has generalized weakness unable to walk due to weakness.  His wife could not get him out of bed today to go to the restroom.  He has not taken his medications at home.  His CBG in short stay was 505.  His past medical history significant for hypertension diabetes. No nausea vomiting diarrhea reported he is on Lasix and ACE inhibitor at home. No fever chills cough shortness of breath reported no dysuria reported History is obtained from the family as patient is not able to provide me any history. He did not have the biopsy done this was canceled due to hypoglycemia and he was sent to the ER from interventional radiology.   In the ER he received IV fluids. Blood pressure 108/60 pulse 64 respiration 15 -98% on room air temperature 97.5. Sodium 136 potassium 3.9 BUN 69 creatinine 1.17 calcium 11.8 alkaline phosphatase 328 albumin 2.5 total bilirubin 1.4 CPK 14 Lactic acid 1.7 White count 14.1 hemoglobin 11.6  Review of Systems: As per HPI otherwise all other systems reviewed and are negative  Ambulatory Status: He is ambulatory at baseline  Past Medical History:  Diagnosis Date   Diabetes mellitus without complication ([place])    Hypertension     History reviewed. No pertinent surgical history.  Social History   Socioeconomic History   Marital status: Married    Spouse name: Not on file   Number of children: Not on file   Years of education: Not on file   Highest education level: Not  on file  Occupational History   Not on file  Tobacco Use   Smoking status: Never   Smokeless tobacco: Never  Vaping Use   Vaping Use: Never used  Substance and Sexual Activity   Alcohol use: Not Currently   Drug use: Not Currently   Sexual activity: Not on file  Other Topics Concern   Not on file  Social History Narrative   Not on file   Social Determinants of Health   Financial Resource Strain: Not on file  Food Insecurity: Not on file  Transportation Needs: Not on file  Physical Activity: Not on file  Stress: Not on file  Social Connections: Not on file  Intimate Partner Violence: Not on file    Allergies  Allergen Reactions   Ciprofloxacin Rash    History reviewed. No pertinent family history.    Prior to Admission medications   Medication Sig Start Date End Date Taking? Authorizing Provider  acarbose (PRECOSE) 100 MG tablet Take 50 mg by mouth in the morning and at bedtime. 07/08/21  Yes [provider]  BYDUREON BCISE 2 MG/0.85ML AUIJ Inject 1 Syringe into the skin once a week. 06/26/21  Yes [provider]  furosemide (LASIX) 20 MG tablet Take 1 tablet (20 mg total) by mouth daily as needed. Patient taking differently: Take 20 mg by mouth daily as needed for fluid or edema. 08/07/21  Yes Derek Jack, MD  JARDIANCE 10  MG TABS tablet Take 10 mg by mouth daily. 07/08/21  Yes [provider]  metFORMIN (GLUCOPHAGE) 1000 MG tablet Take 1,000-1,500 mg by mouth 2 (two) times daily with a meal. Take 1.5 tablets (1500 mg) in the morning and Take 1 tablet (1000 mg) at bedtime 07/08/21  Yes [provider]  amLODipine (NORVASC) 5 MG tablet Take 5 mg by mouth as directed. If BP>130 Take 1 tablet Daily If Not "HOLD MEDICATION* 07/16/21   [provider]  ondansetron (ZOFRAN-ODT) 4 MG disintegrating tablet '4mg'$  ODT q4 hours prn nausea/vomit Patient taking differently: Take 4 mg by mouth every 4 (four) hours as needed for nausea or  vomiting. 07/25/21   Milton Ferguson, MD  valsartan-hydrochlorothiazide (DIOVAN-HCT) 320-25 MG tablet Take 1 tablet by mouth as directed. If BP>130 Take 1 tablet Daily If Not "HOLD MEDICATION* 07/08/21   [provider]    Physical Exam: Vitals:   08/12/21 1530 08/12/21 1545 08/12/21 1600 08/12/21 1615  BP: 101/60 (!) 99/54 106/63 108/60  Pulse: 72 69 69 64  Resp: '20 15 17 15  '$ Temp:      TempSrc:      SpO2: 100% 99% 99% 98%     General:  Appears cachectic appearing ill-appearing wasted eyes:  PERRL, EOMI, normal lids, iris ENT: Oral mucosa dry  neck:  no LAD, masses or thyromegaly Cardiovascular:  RRR, no m/r/g. No LE edema.  Respiratory:  CTA bilaterally, no w/r/r. Normal respiratory effort. Abdomen:  soft, ntnd, NABS Skin:  no rash or induration seen on limited exam It is with 2+ bilateral pitting edema  psychiatric: Unable to assess Neurologic: Unable to assess as patient not able to speak to me appears very lethargic opens his eyes when I called his name and then closes his eyes immediately Labs on Admission: I have personally reviewed following labs and imaging studies  CBC: Recent Labs  Lab 08/07/21 0955 08/12/21 1148 08/12/21 1307  WBC 11.6* 14.6* 14.1*  NEUTROABS 10.1*  --  13.2*  HGB 11.5* 11.9* 11.6*  HCT 36.4* 36.6* 36.7*  MCV 88.3 88.8 89.3  PLT 340 375 366   Basic Metabolic Panel: Recent Labs  Lab 08/07/21 0955 08/12/21 1307  NA 132* 136  K 4.7 3.9  CL 95* 98  CO2 26 23  GLUCOSE 346* 403*  BUN 41* 69*  CREATININE 1.00 1.17  CALCIUM 11.1* 11.8*   GFR: Estimated Creatinine Clearance: 36.6 mL/min (by C-G formula based on SCr of 1.17 mg/dL). Liver Function Tests: Recent Labs  Lab 08/07/21 0955 08/12/21 1307  AST 32 26  ALT 48* 40  ALKPHOS 398* 328*  BILITOT 1.0 1.4*  PROT 7.0 6.5  ALBUMIN 2.8* 2.5*   Recent Labs  Lab 08/12/21 1307  LIPASE 27   Recent Labs  Lab 08/12/21 1308  AMMONIA 10   Coagulation Profile: Recent Labs   Lab 08/12/21 1148  INR 1.0   Cardiac Enzymes: Recent Labs  Lab 08/12/21 1307  CKTOTAL 14*   BNP (last 3 results) No results for input(s): "PROBNP" in the last 8760 hours. HbA1C: No results for input(s): "HGBA1C" in the last 72 hours. CBG: Recent Labs  Lab 08/12/21 1053 08/12/21 1203 08/12/21 1243 08/12/21 1444  GLUCAP 505* 476* 432* 329*   Lipid Profile: No results for input(s): "CHOL", "HDL", "LDLCALC", "TRIG", "CHOLHDL", "LDLDIRECT" in the last 72 hours. Thyroid Function Tests: No results for input(s): "TSH", "T4TOTAL", "FREET4", "T3FREE", "THYROIDAB" in the last 72 hours. Anemia Panel: No results for input(s): "VITAMINB12", "FOLATE", "  FERRITIN", "TIBC", "IRON", "RETICCTPCT" in the last 72 hours. Urine analysis:    Component Value Date/Time   COLORURINE STRAW (A) 07/25/2021 0914   APPEARANCEUR CLEAR 07/25/2021 0914   LABSPEC 1.036 (H) 07/25/2021 0914   PHURINE 5.0 07/25/2021 0914   GLUCOSEU >=500 (A) 07/25/2021 0914   HGBUR NEGATIVE 07/25/2021 0914   BILIRUBINUR NEGATIVE 07/25/2021 0914   KETONESUR NEGATIVE 07/25/2021 0914   PROTEINUR NEGATIVE 07/25/2021 0914   NITRITE NEGATIVE 07/25/2021 0914   LEUKOCYTESUR NEGATIVE 07/25/2021 0914    Creatinine Clearance: Estimated Creatinine Clearance: 36.6 mL/min (by C-G formula based on SCr of 1.17 mg/dL).  Sepsis Labs: '@LABRCNTIP'$ (procalcitonin:4,lacticidven:4) )No results found for this or any previous visit (from the past 240 hour(s)).   Radiological Exams on Admission: No results found.   Assessment/Plan Principal Problem:   AKI (acute kidney injury) ([facility])   #1 dehydration continue IV fluids.  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  Patient's family aware we probably will not be able to do a biopsy at this time.  They are considering palliative care and possible hospice.  Patient is DNR.  Discussed in detail with his wife and daughter at bedside. Palliative  consulted from the ED.  #3 AKI secondary to dehydration decreased p.o. intake.  Hold metformin Lasix and hydrochlorothiazide and valsartan he was taking prior to admission. Normal saline 100 cc an hour.  #4 hypercalcemia secondary to dehydration/malignancy-his calcium was 8.7 in June 2023 and on admission today his calcium is 11.8.  #5 elevated alkaline phosphatase likely related to liver lesions.  Total bilirubin 1.4.  #6 iron deficiency anemia multifactorial.  No active bleeding.     Estimated body mass index is 18.37 kg/m as calculated from the following:   Height as of 08/07/21: '5\' 7"'$  (1.702 m).   Weight as of 08/07/21: 53.2 kg.   DVT prophylaxis: Lovenox Code Status: DNR Family Communication: Discussed with wife and daughter in detail Disposition Plan: Pending palliative consult Consults called: Palliative consult Admission status: Inpatient   Georgette Shell MD  08/12/2021, 4:42 PM

## 2021-08-12 NOTE — ED Triage Notes (Addendum)
Pt arrived via stretcher, from short stay, found CBG to be 505 in short stay. Given 12 units novolog [place]. CBG down to 476.   CBG 432 in triage.   Pt family states big decline in pt status the last week. Pt had been able to walk independently 1 week prior to arrival. Has not been taking POs well. Has only been taking liquids. Has not had a lot of output recently.

## 2021-08-12 NOTE — Progress Notes (Signed)
CBG 505 upon arrival, K. Bruning,PA and Dr Mir notified.  Insulin given and ER bed acquired.  Wife and daughter at bedside to accompany transport to ER rm 51.

## 2021-08-13 ENCOUNTER — Encounter (HOSPITAL_COMMUNITY): Payer: Self-pay

## 2021-08-13 DIAGNOSIS — Z66 Do not resuscitate: Secondary | ICD-10-CM | POA: Diagnosis not present

## 2021-08-13 DIAGNOSIS — R531 Weakness: Secondary | ICD-10-CM

## 2021-08-13 DIAGNOSIS — R739 Hyperglycemia, unspecified: Secondary | ICD-10-CM

## 2021-08-13 DIAGNOSIS — K8689 Other specified diseases of pancreas: Secondary | ICD-10-CM

## 2021-08-13 DIAGNOSIS — Z515 Encounter for palliative care: Secondary | ICD-10-CM

## 2021-08-13 DIAGNOSIS — N179 Acute kidney failure, unspecified: Secondary | ICD-10-CM | POA: Diagnosis not present

## 2021-08-13 LAB — CBC
HCT: 38.6 % — ABNORMAL LOW (ref 39.0–52.0)
Hemoglobin: 12.2 g/dL — ABNORMAL LOW (ref 13.0–17.0)
MCH: 28.6 pg (ref 26.0–34.0)
MCHC: 31.6 g/dL (ref 30.0–36.0)
MCV: 90.6 fL (ref 80.0–100.0)
Platelets: 361 10*3/uL (ref 150–400)
RBC: 4.26 MIL/uL (ref 4.22–5.81)
RDW: 16.1 % — ABNORMAL HIGH (ref 11.5–15.5)
WBC: 16 10*3/uL — ABNORMAL HIGH (ref 4.0–10.5)
nRBC: 0 % (ref 0.0–0.2)

## 2021-08-13 LAB — COMPREHENSIVE METABOLIC PANEL
ALT: 38 U/L (ref 0–44)
AST: 29 U/L (ref 15–41)
Albumin: 2.4 g/dL — ABNORMAL LOW (ref 3.5–5.0)
Alkaline Phosphatase: 286 U/L — ABNORMAL HIGH (ref 38–126)
Anion gap: 13 (ref 5–15)
BUN: 48 mg/dL — ABNORMAL HIGH (ref 8–23)
CO2: 24 mmol/L (ref 22–32)
Calcium: 11.6 mg/dL — ABNORMAL HIGH (ref 8.9–10.3)
Chloride: 103 mmol/L (ref 98–111)
Creatinine, Ser: 0.78 mg/dL (ref 0.61–1.24)
GFR, Estimated: >60 mL/min (ref >60)
Glucose, Bld: 295 mg/dL — ABNORMAL HIGH (ref 70–99)
Potassium: 4 mmol/L (ref 3.5–5.1)
Sodium: 140 mmol/L (ref 135–145)
Total Bilirubin: 1.1 mg/dL (ref 0.3–1.2)
Total Protein: 6.1 g/dL — ABNORMAL LOW (ref 6.5–8.1)

## 2021-08-13 LAB — GLUCOSE, CAPILLARY
Glucose-Capillary: 255 mg/dL — ABNORMAL HIGH (ref 70–99)
Glucose-Capillary: 362 mg/dL — ABNORMAL HIGH (ref 70–99)
Glucose-Capillary: 308 mg/dL — ABNORMAL HIGH (ref 70–99)

## 2021-08-13 MED ORDER — SODIUM CHLORIDE 0.9 % IV SOLN
4.0000 mg | Freq: Once | INTRAVENOUS | Status: AC
  Administered 2021-08-13: 4 mg via INTRAVENOUS
  Filled 2021-08-13: qty 5

## 2021-08-13 MED ORDER — INSULIN GLARGINE-YFGN 100 UNIT/ML ~~LOC~~ SOLN
4.0000 [IU] | Freq: Every day | SUBCUTANEOUS | Status: DC
  Administered 2021-08-13 – 2021-08-16 (×4): 4 [IU] via SUBCUTANEOUS
  Filled 2021-08-13 (×4): qty 0.04

## 2021-08-13 NOTE — Plan of Care (Signed)

## 2021-08-13 NOTE — Progress Notes (Addendum)
PROGRESS NOTE    Nickoli Bagheri  YOV:[id number] DOB: 1940-09-29 DOA: 08/12/2021 PCP: Neale Burly, MD   Brief Narrative:Jeffery Bryant is a 81 y.o. male with medical history significant of pancreatic mass with liver lesions came to interventional radiology for biopsy today.  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  His appetite has been poor.  He has no nausea vomiting.  He has generalized weakness unable to walk due to weakness.  His wife could not get him out of bed today to go to the restroom.  He has not taken his medications at home.  His CBG in short stay was 505.  His past medical history significant for hypertension diabetes. No nausea vomiting diarrhea reported he is on Lasix and ACE inhibitor at home. No fever chills cough shortness of breath reported no dysuria reported History is obtained from the family as patient is not able to provide me any history. He did not have the biopsy done this was canceled due to hypoglycemia and he was sent to the ER from interventional radiology.     In the ER he received IV fluids. Blood pressure 108/60 pulse 64 respiration 15 -98% on room air temperature 97.5. Sodium 136 potassium 3.9 BUN 69 creatinine 1.17 calcium 11.8 alkaline phosphatase 328 albumin 2.5 total bilirubin 1.4 CPK 14 Lactic acid 1.7 White count 14.1 hemoglobin 11.6  Assessment & Plan:   Principal Problem:   AKI (acute kidney injury) ([place])    #1 dehydration due to decreased p.o. intake improved.  Continue IV fluids.   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  Patient's family aware we probably will not be able to do a biopsy at this time.  They are considering palliative care and possible hospice.  Patient is DNR.  Discussed in detail with his wife and daughter at bedside. Palliative consulted.   #3 AKI resolved.  Secondary to dehydration  decreased p.o. intake.  Hold metformin Lasix and hydrochlorothiazide and valsartan he was taking prior to admission.  His blood pressure is still soft 99/60 we will continue NS.    #4 hypercalcemia secondary to dehydration/malignancy-his calcium was 8.7 in June 2023 and on admission today his calcium is 11.8.   [place] today Recheck labs in am  #5 elevated alkaline phosphatase likely related to liver lesions.  Total bilirubin 1.4.   #6 iron deficiency anemia multifactorial.  No active bleeding.  #7 type 2 diabetes with hyperglycemia uncontrolled with A1c of 11.1. Start Lantus 40 units nightly with SSI.     Estimated body mass index is 18.37 kg/m as calculated from the following:   Height as of 08/07/21: '5\' 7"'$  (1.702 m).   Weight as of 08/07/21: 53.2 kg.  DVT prophylaxis: lovenox Code Status: dnr  Family Communication: dw family Disposition Plan:  Status is: Inpatient Remains inpatient appropriate because: dehydration   Consultants:  Palliative   Procedures:  Antimicrobials:  Subjective:  Wife by the bedside patient sitting up in chair he is awake and alert and able to answer my questions appears still weak Received IV fluids overnight. Objective: Vitals:   08/12/21 2301 08/13/21 0318 08/13/21 0654 08/13/21 1400  BP: 95/66 120/70 113/63 99/60  Pulse: 78 78 88 72  Resp: '18 20 18 17  '$ Temp: 97.9 F (36.6 C) 98 F (36.7 C) (!) 97.5 F (36.4 C) 97.7  F (36.5 C)  TempSrc: Oral Oral Oral   SpO2: 98% 99% 98% 100%    Intake/Output Summary (Last 24 hours) at 08/13/2021 1521 Last data filed at 08/13/2021 1500 Gross per 24 hour  Intake 1610.53 ml  Output 750 ml  Net 860.53 ml   There were no vitals filed for this visit.  Examination: Elderly cachectic appearing male in no acute distress appears chronically ill General exam: Appears in no acute distress.  Sitting up in chair.  Awake and alert.     Respiratory system: Clear to auscultation. Respiratory effort  normal. Cardiovascular system: S1 & S2 heard, RRR. No JVD, murmurs, rubs, gallops or clicks. No pedal edema. Gastrointestinal system: Abdomen is nondistended, soft and nontender. No organomegaly or masses felt. Normal bowel sounds heard. Central nervous system: Alert and oriented. No focal neurological deficits. Extremities: 2+ pitting edema  skin: No rashes, lesions or ulcers Psychiatry: Judgement and insight appear normal. Mood & affect appropriate.     Data Reviewed: I have personally reviewed following labs and imaging studies  CBC: Recent Labs  Lab 08/07/21 0955 08/12/21 1148 08/12/21 1307 08/12/21 1641 08/13/21 0609  WBC 11.6* 14.6* 14.1* 12.2* 16.0*  NEUTROABS 10.1*  --  13.2*  --   --   HGB 11.5* 11.9* 11.6* 10.5* 12.2*  HCT 36.4* 36.6* 36.7* 32.8* 38.6*  MCV 88.3 88.8 89.3 88.2 90.6  PLT 340 375 368 306 637   Basic Metabolic Panel: Recent Labs  Lab 08/07/21 0955 08/12/21 1307 08/12/21 1641 08/13/21 0609  NA 132* 136  --  140  K 4.7 3.9  --  4.0  CL 95* 98  --  103  CO2 26 23  --  24  GLUCOSE 346* 403*  --  295*  BUN 41* 69*  --  48*  CREATININE 1.00 1.17 0.95 0.78  CALCIUM 11.1* 11.8*  --  11.6*   GFR: Estimated Creatinine Clearance: 53.6 mL/min (by C-G formula based on SCr of 0.78 mg/dL). Liver Function Tests: Recent Labs  Lab 08/07/21 0955 08/12/21 1307 08/13/21 0609  AST 32 26 29  ALT 48* 40 38  ALKPHOS 398* 328* 286*  BILITOT 1.0 1.4* 1.1  PROT 7.0 6.5 6.1*  ALBUMIN 2.8* 2.5* 2.4*   Recent Labs  Lab 08/12/21 1307  LIPASE 27   Recent Labs  Lab 08/12/21 1308  AMMONIA 10   Coagulation Profile: Recent Labs  Lab 08/12/21 1148  INR 1.0   Cardiac Enzymes: Recent Labs  Lab 08/12/21 1307  CKTOTAL 14*   BNP (last 3 results) No results for input(s): "PROBNP" in the last 8760 hours. HbA1C: Recent Labs    08/12/21 1641  HGBA1C 11.1*   CBG: Recent Labs  Lab 08/12/21 1243 08/12/21 1444 08/12/21 1724 08/13/21 0736  08/13/21 1214  GLUCAP 432* 329* 278* 255* 362*   Lipid Profile: No results for input(s): "CHOL", "HDL", "LDLCALC", "TRIG", "CHOLHDL", "LDLDIRECT" in the last 72 hours. Thyroid Function Tests: No results for input(s): "TSH", "T4TOTAL", "FREET4", "T3FREE", "THYROIDAB" in the last 72 hours. Anemia Panel: No results for input(s): "VITAMINB12", "FOLATE", "FERRITIN", "TIBC", "IRON", "RETICCTPCT" in the last 72 hours. Sepsis Labs: Recent Labs  Lab 08/12/21 1307  LATICACIDVEN 1.7    No results found for this or any previous visit (from the past 240 hour(s)).       Radiology Studies: No results found.      Scheduled Meds:  enoxaparin (LOVENOX) injection  30 mg Subcutaneous Q24H   feeding supplement  237 mL Oral BID  BM   insulin aspart  0-9 Units Subcutaneous TID WC   Continuous Infusions:  sodium chloride 100 mL/hr at 08/13/21 1226     LOS: 1 day    Time spent: 45 min  Georgette Shell, MD 08/13/2021, 3:21 PM

## 2021-08-13 NOTE — Progress Notes (Signed)
Initial Nutrition Assessment  DOCUMENTATION CODES:   Underweight  INTERVENTION:  Ensure Enlive po BID, each supplement provides 350 kcal and 20 grams of protein.  NUTRITION DIAGNOSIS:   Inadequate oral intake related to decreased appetite as evidenced by per patient/family report.  GOAL:   Patient will meet greater than or equal to 90% of their needs  MONITOR:   PO intake, Supplement acceptance, Labs, Diet advancement, Weight trends  REASON FOR ASSESSMENT:   Malnutrition Screening Tool    ASSESSMENT:   Pt admitted from home with weakness, found to have AKI. PMH significant for pancreatic mass with liver lesions. Presented to IR for biopsy however this was cancelled d/t hypoglycemia and was sent to the ED for evaluation.  Palliative Care following for GOC. Pending IR re-evaluation for biopsy.   Pt resting at time of visit. His wife and daughter present at bedside. They report that he has had a decrease in appetite and poor PO intake recently. 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 He is currently on a full liquid diet and had received cream of potato soup for lunch, as well as an Ensure and they were pleased that he consumed some of these. Will continue to offer Ensure during admission and support as appropriate.   No documented meal completions on file.  Reviewed wt history. There is limited documentation of wt on file within the last year. It appears he has had a 10.4% wt loss between 6/29-7/12 which is significant for time frame.   Pt likely has a degree of malnutrition given reported decrease in appetite and significant wt loss, however unable to confirm at this time given limited recall of meal intakes and inability to perform nutrition focused physical exam. Will continue to reassess as appropriate if able to obtain more detailed history and nutrition  focused physical exam.   Medications: SSI 0-9 units TID, semglee 4 units daily  Labs: BUN 48, Ca 11.6, alkaline phosphatase 286, HgbA1c 11.1%, CBG's 255-432 x24 hours  UOP: 950 ml x12 hours  I/O's: +1989m since admission  NUTRITION - FOCUSED PHYSICAL EXAM: Deferred to follow up to allow for comfortable resting.   Diet Order:   Diet Order             Diet full liquid Room service appropriate? Yes; Fluid consistency: Thin  Diet effective now                   EDUCATION NEEDS:   No education needs have been identified at this time  Skin:  Skin Assessment: Reviewed RN Assessment  Last BM:  PTA  Height:   Ht Readings from Last 1 Encounters:  08/07/21 '5\' 7"'$  (1.702 m)    Weight:   Wt Readings from Last 1 Encounters:  08/07/21 53.2 kg   BMI:  There is no height or weight on file to calculate BMI.  Estimated Nutritional Needs:   Kcal:  1600-1800  Protein:  80-95g  Fluid:  >/=1.6L  AClayborne Dana RDN, LDN Clinical Nutrition

## 2021-08-13 NOTE — Consult Note (Signed)
Consultation Note Date: 08/13/2021   Patient Name: Jeffery Bryant  DOB: [birth year]  MRN: [id number]  Age / Sex: 81 y.o., male  PCP: Neale Burly, MD Referring Physician: Georgette Shell, MD  Reason for Consultation: Establishing goals of care  HPI/Patient Profile: 81 y.o. male  with past medical history of pancreatic mass with liver lesions admitted on 08/12/2021 from IR after biopsy attempt was unsuccessful d/t short stay/pre-procedure CBG being over 500. Pt is being treated for dehydration, AKI secondary to dehydration, hypercalcemia, elevated alkaline phosphatase, and iron deficiency anemia.  Palliative medicine team was consulted to discuss goals of care.  Clinical Assessment and Goals of Care: I have reviewed medical records including EPIC notes, labs and imaging, assessed the patient and then met first with patient and his wife at bedside and then later in the afternoon with patient, his wife, and patient's stepdaughter Jeffery Bryant to discuss diagnosis prognosis, [place], EOL wishes, disposition and options.  I introduced Palliative Medicine as specialized medical care for people living with serious illness. It focuses on providing relief from the symptoms and stress of a serious illness. The goal is to improve quality of life for both the patient and the family.  We discussed a brief life review of the patient.  Patient and his wife together for over 29 years.  Patient has 2 biological children of his own and his wife is 3 of her own.  Together they have 6 grandchildren and 1 great grandchild.  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  As far as functional and nutritional status prior to admission patient and wife endorse patient has become increasingly weaker and appetite has decreased over the last 3 months. He has had multiple falls and difficulty with  transferring OOB without max assistance. Patient endorses a poor appetite and only drinking liquids over the past several weeks.   We discussed patient's current illness and what it means in the larger context of patient's on-going co-morbidities.  Natural disease trajectory and expectations at EOL were discussed. 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 I detailed that with or without biopsy that oncology would need to make recommendations, if any, as far as treatment. Reviewed  patient's poor functional and nutritional status. However, pt repeatedly stated that he will "do whatever Dr. Raliegh Ip recommends".   I attempted to elicit values and goals of care important to the patient.  Patient shared he is worried about how his wife will do when he is gone, he is concerned about his business and his sons taking it over, but that he is not afraid to die since he knows he is in the hands of the [facility].   Family is facing treatment option decisions, advanced directive, and anticipatory care needs. Pt is adamant that he wants to get a biopsy and return home to await results and treatment recommendations from Dr. Raliegh Ip. He also states he would want his daughter Jeffery Bryant involved in these discussions.   I met  with Jeffery Bryant later in the day and dicussed the above. Reviewed pancreatic cancer with liver lesions in an 81 year old male has a short prognosis if not treatment is available/offered. Jeffery Bryant has a healthy understanding that patient is likely reaching EOL but she also appreciates that patient wants to be in control od decisions. She and patient's wife would like to discuss options with oncologist so patient can know full range of appropriate, offered, and available treatments.     Discussed with patient/family the importance of continued conversation with family and the medical providers regarding overall plan of care and treatment options, ensuring decisions are within the context of the patient's values and  GOCs.    Hospice and Palliative Care services outpatient were explained and offered. Pt and wife declined [place] and any in-home assistance. Pt wishes to return home with care of wife, though wife has vocalized she has a bad back and has had great difficulty taking care of him to date.   Questions and concerns were addressed. The family was encouraged to call with questions or concerns.   Dr. Rodena Piety and Dr. Delton Coombes made aware of family's wishes to speak with oncology. Dr. Delton Coombes recommended IR re-evaluate pt to have biopsy.   Primary Decision Maker PATIENT  Code Status/Advance Care Planning: DNR  Prognosis:   < 6 months  Discharge Planning: To Be Determined  Primary Diagnoses: Present on Admission:  AKI (acute kidney injury) [facility])   Physical Exam Vitals reviewed.  Constitutional:      Comments: Thin, frail  Eyes:     Pupils: Pupils are equal, round, and reactive to light.  Cardiovascular:     Rate and Rhythm: Normal rate.     Pulses: Normal pulses.  Pulmonary:     Effort: Pulmonary effort is normal.  Abdominal:     Palpations: Abdomen is soft.  Musculoskeletal:     Right lower leg: Edema present.     Left lower leg: Edema present.     Comments: Generalized weakness  Skin:    General: Skin is warm and dry.  Neurological:     Mental Status: He is alert and oriented to person, place, and time.  Psychiatric:        Mood and Affect: Mood normal.     Palliative Assessment/Data: 40%     I discussed this patient's plan of care with patient, patient's wife, patient's stepdaughter Jeffery Bryant, Dr. Rodena Piety, Dr. Delton Coombes.  Thank you for this consult. Palliative medicine will continue to follow and assist holistically.   Time Total: 120 minutes Greater than 50%  of this time was spent counseling and coordinating care related to the above assessment and plan.  Signed by: Jordan Hawks, DNP, FNP-BC Palliative Medicine    Please contact Palliative Medicine Team  phone at [phone] for questions and concerns.  For individual provider: See Shea Evans

## 2021-08-13 NOTE — Progress Notes (Signed)
Notification received from scheduling that patient's daughter has called to cancel all follow-up here at [facility]. She states that patient is currently hospitalized and will follow-up with palliative care. Dr. Delton Coombes made aware. All appts cancelled.

## 2021-08-14 ENCOUNTER — Encounter (HOSPITAL_COMMUNITY): Payer: Self-pay | Admitting: Internal Medicine

## 2021-08-14 DIAGNOSIS — Z7189 Other specified counseling: Secondary | ICD-10-CM

## 2021-08-14 DIAGNOSIS — K8689 Other specified diseases of pancreas: Secondary | ICD-10-CM

## 2021-08-14 DIAGNOSIS — Z66 Do not resuscitate: Secondary | ICD-10-CM

## 2021-08-14 DIAGNOSIS — N179 Acute kidney failure, unspecified: Secondary | ICD-10-CM | POA: Diagnosis not present

## 2021-08-14 DIAGNOSIS — Z515 Encounter for palliative care: Secondary | ICD-10-CM

## 2021-08-14 LAB — COMPREHENSIVE METABOLIC PANEL
ALT: 38 U/L (ref 0–44)
AST: 32 U/L (ref 15–41)
Albumin: 2 g/dL — ABNORMAL LOW (ref 3.5–5.0)
Alkaline Phosphatase: 286 U/L — ABNORMAL HIGH (ref 38–126)
Anion gap: 6 (ref 5–15)
BUN: 35 mg/dL — ABNORMAL HIGH (ref 8–23)
CO2: 24 mmol/L (ref 22–32)
Calcium: 10.9 mg/dL — ABNORMAL HIGH (ref 8.9–10.3)
Chloride: 105 mmol/L (ref 98–111)
Creatinine, Ser: 0.55 mg/dL — ABNORMAL LOW (ref 0.61–1.24)
GFR, Estimated: >60 mL/min (ref >60)
Glucose, Bld: 198 mg/dL — ABNORMAL HIGH (ref 70–99)
Potassium: 3.4 mmol/L — ABNORMAL LOW (ref 3.5–5.1)
Sodium: 135 mmol/L (ref 135–145)
Total Bilirubin: 0.7 mg/dL (ref 0.3–1.2)
Total Protein: 5.3 g/dL — ABNORMAL LOW (ref 6.5–8.1)

## 2021-08-14 LAB — CBC
HCT: 35.2 % — ABNORMAL LOW (ref 39.0–52.0)
Hemoglobin: 11.1 g/dL — ABNORMAL LOW (ref 13.0–17.0)
MCH: 28.4 pg (ref 26.0–34.0)
MCHC: 31.5 g/dL (ref 30.0–36.0)
MCV: 90 fL (ref 80.0–100.0)
Platelets: 230 10*3/uL (ref 150–400)
RBC: 3.91 MIL/uL — ABNORMAL LOW (ref 4.22–5.81)
RDW: 15.9 % — ABNORMAL HIGH (ref 11.5–15.5)
WBC: 12.4 10*3/uL — ABNORMAL HIGH (ref 4.0–10.5)
nRBC: 0 % (ref 0.0–0.2)

## 2021-08-14 LAB — GLUCOSE, CAPILLARY
Glucose-Capillary: 266 mg/dL — ABNORMAL HIGH (ref 70–99)
Glucose-Capillary: 178 mg/dL — ABNORMAL HIGH (ref 70–99)
Glucose-Capillary: 282 mg/dL — ABNORMAL HIGH (ref 70–99)

## 2021-08-14 NOTE — TOC Initial Note (Addendum)
Transition of Care [facility]) - Initial/Assessment Note    Patient Details  Name: Jeffery Bryant MRN: [id number] Date of Birth: 12/21/1940  Transition of [facility]) CM/SW Contact:    Vassie Moselle, [place] Phone Number: 08/14/2021, 9:06 AM  Clinical Narrative:                 Palliative care involved with pt/family. Pt/family currently declining HH and Hospice services. TOC will continue to follow for recommendations and discharge planning.   Expected Discharge Plan: Home w Hospice Care Barriers to Discharge: Continued Medical Work up   Patient Goals and CMS Choice Patient states their goals for this hospitalization and ongoing recovery are:: To return home   Choice offered to / list presented to : Patient, Spouse, Adult Children  Expected Discharge Plan and Services Expected Discharge Plan: Home w Hospice Care In-house Referral: Hospice / Palliative Care Discharge Planning Services: NA Post Acute Care Choice: Hospice Living arrangements for the past 2 months: Single Family Home                 DME Arranged: N/A DME Agency: NA                  Prior Living Arrangements/Services Living arrangements for the past 2 months: Single Family Home Lives with:: Spouse Patient language and need for interpreter reviewed:: Yes Do you feel safe going back to the place where you live?: Yes      Need for Family Participation in Patient Care: Yes (Comment) Care giver support system in place?: No (comment)   Criminal Activity/Legal Involvement Pertinent to Current Situation/Hospitalization: No - Comment as needed  Activities of Daily Living Home Assistive Devices/Equipment: Eyeglasses, Hearing aid (reading glasses, bilateral hearing aides) ADL Screening (condition at time of admission) Patient's cognitive ability adequate to safely complete daily activities?: Yes Is the patient deaf or have difficulty hearing?: Yes Does the patient have difficulty seeing, even when wearing  glasses/contacts?: No Does the patient have difficulty concentrating, remembering, or making decisions?: No Patient able to express need for assistance with ADLs?: Yes Does the patient have difficulty dressing or bathing?: Yes Independently performs ADLs?: No Communication: Independent Dressing (OT): Needs assistance Is this a change from baseline?: Pre-admission baseline Grooming: Independent Feeding: Independent Bathing: Needs assistance Is this a change from baseline?: Pre-admission baseline Toileting: Needs assistance Is this a change from baseline?: Pre-admission baseline In/Out Bed: Needs assistance Is this a change from baseline?: Pre-admission baseline Walks in Home: Needs assistance Is this a change from baseline?: Pre-admission baseline Does the patient have difficulty walking or climbing stairs?: Yes Weakness of Legs: Both Weakness of Arms/Hands: Both  Permission Sought/Granted                  Emotional Assessment       Orientation: : Oriented to Self, Oriented to Place, Oriented to  Time, Oriented to Situation Alcohol / Substance Use: Not Applicable Psych Involvement: No (comment)  Admission diagnosis:  Weakness [R53.1] Hyperglycemia [R73.9] AKI (acute kidney injury) ([place]) [N17.9] Patient Active Problem List   Diagnosis Date Noted   AKI (acute kidney injury) ([place]) 08/12/2021   Pancreatic mass 08/07/2021   Liver masses 08/07/2021   PCP:  Neale Burly, MD Pharmacy:   [facility] [phone] - [place], [address] [place] [address] Phone: [phone] Fax: [place] [address], [address]  [place] [address] Phone: [phone] Fax: [phone]     Social Determinants of Health (SDOH) Interventions    Readmission Risk Interventions    08/14/2021    9:05 AM  Readmission Risk Prevention Plan  Post Dischage  Appt Complete  Medication Screening Complete  Transportation Screening Complete

## 2021-08-14 NOTE — Progress Notes (Signed)
Daily Progress Note   Patient Name: Jeffery Bryant       Date: 08/14/2021 DOB: [birth year]  Age: 81 y.o. MRN#: [id number] Attending Physician: Karie Kirks, DO Primary Care Physician: Neale Burly, MD Admit Date: 08/12/2021  Reason for Consultation/Follow-up: Establishing goals of care  Subjective: Patient sleeping - wakes to voice but quickly back to sleep. Son at bedside reports some complaints of mild chronic lower back pain.  Length of Stay: 2  Current Medications: Scheduled Meds:   enoxaparin (LOVENOX) injection  30 mg Subcutaneous Q24H   feeding supplement  237 mL Oral BID BM   insulin aspart  0-9 Units Subcutaneous TID WC   insulin glargine-yfgn  4 Units Subcutaneous Daily    Continuous Infusions:  sodium chloride 75 mL/hr at 08/14/21 1126    PRN Meds: ondansetron (ZOFRAN) IV  Physical Exam Constitutional:      General: He is not in acute distress.    Appearance: He is ill-appearing.     Comments: lethargic  Pulmonary:     Effort: Pulmonary effort is normal.  Skin:    General: Skin is warm and dry.             Vital Signs: BP 108/72 (BP Location: Right Arm)   Pulse 84   Temp 98.1 F (36.7 C) (Oral)   Resp 18   SpO2 96%  SpO2: SpO2: 96 % O2 Device: O2 Device: Room Air O2 Flow Rate:    Intake/output summary:  Intake/Output Summary (Last 24 hours) at 08/14/2021 1526 Last data filed at 08/14/2021 1425 Gross per 24 hour  Intake 1638.11 ml  Output 1350 ml  Net 288.11 ml   LBM:   Baseline Weight:   Most recent weight:         Palliative Assessment/Data: PPS 40%      Patient Active Problem List   Diagnosis Date Noted   AKI (acute kidney injury) ([place]) 08/12/2021   Pancreatic mass 08/07/2021   Liver masses 08/07/2021    Palliative Care Assessment & Plan    HPI: 81 y.o. male  with past medical history of pancreatic mass with liver lesions admitted on 08/12/2021 from IR after biopsy attempt was unsuccessful d/t short stay/pre-procedure CBG being over 500. Pt is being treated for dehydration, AKI secondary to dehydration, hypercalcemia, elevated alkaline phosphatase, and iron deficiency anemia.   Palliative medicine team was consulted to discuss goals of care.  Assessment: Follow up today. Goals of care discussion from 7/18 reviewed. Son Shanon Brow at bedside at my arrival. Patient sleeping and minimally interactive. With Shanon Brow we review patient's goals -patient very much interested in pursuing biopsy and family supports this decision.  Departures family understands likely prognosis and outcomes.  We discussed that the medical team understands patient would like to speak with oncology and that can happen after the biopsy results are received.  Family understands.  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  Family plans to support patient through what ever decisions he makes.  We discussed palliative team will remain available for support -they have our contact  information.  We discussed we will follow chart for results.  Recommendations/Plan: Plans for biopsy tomorrow, patient interested in speaking with oncology following biopsy PMT will follow chart  Code Status: DNR  Care plan was discussed with patient and son  Thank you for allowing the Palliative Medicine Team to assist in the care of this patient.  *Please note that this is a verbal dictation therefore any spelling or grammatical errors are due to the "[employer]" system interpretation.  Juel Burrow, DNP, [facility] Palliative Medicine Team Team Phone # [phone]  Pager [phone]

## 2021-08-14 NOTE — Progress Notes (Signed)
Blood glucose was 266 . Machine did not dock reading .

## 2021-08-14 NOTE — Progress Notes (Signed)
PROGRESS NOTE  Jeffery Bryant IEP:[id number] DOB: Nov 08, 1940 DOA: 08/12/2021 PCP: Neale Burly, MD  Brief History   Jeffery Bryant is a 81 y.o. male with medical history significant of pancreatic mass with liver lesions came to interventional radiology for biopsy today.  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  His appetite has been poor.  He has no nausea vomiting.  He has generalized weakness unable to walk due to weakness.  His wife could not get him out of bed today to go to the restroom.  He has not taken his medications at home.  His CBG in short stay was 505.  His past medical history significant for hypertension diabetes. No nausea vomiting diarrhea reported he is on Lasix and ACE inhibitor at home. No fever chills cough shortness of breath reported no dysuria reported History is obtained from the family as patient is not able to provide me any history. He did not have the biopsy done this was canceled due to hypoglycemia and he was sent to the ER from interventional radiology.     In the ER he received IV fluids. Blood pressure 108/60 pulse 64 respiration 15 -98% on room air temperature 97.5. Sodium 136 potassium 3.9 BUN 69 creatinine 1.17 calcium 11.8 alkaline phosphatase 328 albumin 2.5 total bilirubin 1.4 CPK 14 Lactic acid 1.7 White count 14.1 hemoglobin 11.6  Oncology has been consulted. The patient has a liver biopsy ordered through IR. This will take place on the 20th. It is felt that the patient is unlikely to tolerate any systemic therapy. The patient and family have met with palliative care. Although the patient is not a candidate for aggressive therapy, he was refusing hospice at the time of palliative care consult.   Consultants  Oncology Palliative care  Procedures  Liver biopsy  Antibiotics   Anti-infectives (From admission, onward)    None      Subjective  The patient is  resting comfortably. No new complaints.  Objective   Vitals:  Vitals:   08/14/21 0549 08/14/21 1421  BP: 128/74 108/72  Pulse: 73 84  Resp: 17 18  Temp: 98 F (36.7 C) 98.1 F (36.7 C)  SpO2: 98% 96%    Exam:  Constitutional:  Appears calm and comfortable Eyes:  pupils and irises appear normal Normal lids and conjunctivae ENMT:  grossly normal hearing  Lips appear normal external ears, nose appear normal Oropharynx: mucosa, tongue,posterior pharynx appear normal Neck:  neck appears normal, no masses, normal ROM, supple no thyromegaly Respiratory:  CTA bilaterally, no w/r/r.  Respiratory effort normal. No retractions or accessory muscle use Cardiovascular:  RRR, no m/r/g No LE extremity edema   Normal pedal pulses Abdomen:  Abdomen appears normal; no tenderness or masses No hernias No HSM Musculoskeletal:  Digits/nails BUE: no clubbing, cyanosis, petechiae, infection exam of joints, bones, muscles of at least one of following: head/neck, RUE, LUE, RLE, LLE   strength and tone normal, no atrophy, no abnormal movements No tenderness, masses Normal ROM, no contractures  gait and station Skin:  No rashes, lesions, ulcers palpation of skin: no induration or nodules Neurologic:  CN 2-12 intact Sensation all 4 extremities intact Psychiatric:  Mental status Mood, affect appropriate Orientation to person, place, time  judgment and insight appear intact    I have personally reviewed the following:   Today's Data  Vitals  Lab Data  CMP  CBC  Micro Data    Imaging    Cardiology Data    Other Data    Scheduled Meds:  enoxaparin (LOVENOX) injection  30 mg Subcutaneous Q24H   feeding supplement  237 mL Oral BID BM   insulin aspart  0-9 Units Subcutaneous TID WC   insulin glargine-yfgn  4 Units Subcutaneous Daily   Continuous Infusions:  sodium chloride 75 mL/hr at 08/14/21 1126    Problem  DM II (Diabetes Mellitus, Type Ii), Controlled (Hcc)   Hypercalcemia  Pancreatic Mass  Liver Masses   #1 Dehydration due to decreased p.o. intake improved.  Resolving.   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  Patient's family aware we probably will not be able to do a biopsy at this time.  They are considering palliative care and possible hospice.  Patient is DNR.  Discussed in detail with his wife and daughter at bedside. Biopsy planned for 7/20. Palliative consulted.   #3 AKI resolved.  Secondary to dehydration decreased p.o. intake.  Hold metformin Lasix and hydrochlorothiazide and valsartan he was taking prior to admission.  His blood pressure is still soft 99/60 we will continue NS.   #4 hypercalcemia: secondary to dehydration/malignancy-his calcium was 8.7 in June 2023 and on admission today his calcium is 11.6.  Improving on Zometa.  #5 elevated alkaline phosphatase : likely related to liver lesions.  Total bilirubin 1.4.   #6 iron deficiency anemia multifactorial.  No active bleeding.   #7 type 2 diabetes with hyperglycemia uncontrolled with A1c of 11.1. Start Lantus 40 units nightly with SSI.   I have seen and examined this patient myself. I have spent 34 minutes in his evaluation and care.   Estimated body mass index is 18.37 kg/m as calculated from the following:   Height as of 08/07/21: _0  (1.702 m).   Weight as of 08/07/21: 53.2 kg.   DVT prophylaxis: lovenox Code Status: dnr  Family Communication: dw family Disposition Plan:  Status is: Inpatient Remains inpatient appropriate because: dehydration   LOS: 2 days   Aniella Wandrey, DO [facility] Direct contact: see [email or web address]  7PM-7AM contact night coverage as above 08/14/2021, 6:42 PM  LOS: 2 days

## 2021-08-14 NOTE — Consult Note (Addendum)
Chief Complaint: Patient was seen in consultation today for liver lesions Chief Complaint  Patient presents with   Hyperglycemia   at the request of Landis Gandy   Referring Physician(s): Landis Gandy  Supervising Physician: Michaelle Birks  Patient Status: [facility] - In-pt  History of Present Illness: Jeffery Bryant is a 81 y.o. male with PMHs of HTN, DM, prostate CA and recent finding of pancreatic mass and multiple liver lesions.   Patient presented to [facility] ED on 07/25/21 due to weakness and weight loss, underwent CT AP with which showed:  There is 4.3 cm low-density space-occupying lesion in the body of pancreas. There are multiple space-occupying lesions of varying sizes in the liver. 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 Follow-up PET-CT and tissue sampling should be considered.   There is no evidence of intestinal obstruction or pneumoperitoneum. There is no hydronephrosis.   Gallbladder stone. Marked enlargement of prostate. Coronary artery calcifications are seen.   Other findings as described in the body of the report.  Patient was referred to oncology for further evaluation and management, and a liver lesion biopsy was recommended to the patient. He presented to [facility] on 7/17 for outpatient liver lesion biopsy, the procedure was canceled when it was found that patient had hyperglycemia over 500. Patient was sent to ED for further evaluation . Since then he has been treated for dehydration, AKI secondary to dehydration, hypercalcemia, elevated alkaline phosphatase, and iron deficiency anemia.  Palliative care team has met with the patient and his family yesterday, and after thorough discussion, patient expressed his strong wishes to proceed with the liver lesion biopsy.   Patient seen at bedside.   Sitting in bed, NAD. Spouse, daughter, and son at bedside.  Patient currently has no  complaints, denies HA. Fever, chills, SOB, N/, abdominal pain.  Risks and benefit  of liver lesion biopsy was discussed in detail.  Patient was informed that the DNR order is rescinded during the procedure and he may be intubated during the procedure if it is neccessary. He was notified that with the current clinical status that he is in, he may never be able to be extubated, which will prevent him from leaving the hospital.  Patient and family members verbalized understanding, and he would like to proceed with the liver lesion biopsy.    Past Medical History:  Diagnosis Date   Diabetes mellitus without complication ([place])    Hypertension     History reviewed. No pertinent surgical history.  Allergies: Ciprofloxacin  Medications: Prior to Admission medications   Medication Sig Start Date End Date Taking? Authorizing Provider  acarbose (PRECOSE) 100 MG tablet Take 50 mg by mouth in the morning and at bedtime. 07/08/21  Yes [provider]  BYDUREON BCISE 2 MG/0.85ML AUIJ Inject 1 Syringe into the skin once a week. 06/26/21  Yes [provider]  furosemide (LASIX) 20 MG tablet Take 1 tablet (20 mg total) by mouth daily as needed. Patient taking differently: Take 20 mg by mouth daily as needed for fluid or edema. 08/07/21  Yes Derek Jack, MD  JARDIANCE 10 MG TABS tablet Take 10 mg by mouth daily. 07/08/21  Yes [provider]  metFORMIN (GLUCOPHAGE) 1000 MG tablet Take 1,000-1,500 mg by mouth 2 (two) times daily with a meal. Take 1.5 tablets (1500 mg) in the morning and Take 1 tablet (1000 mg) at bedtime 07/08/21  Yes [provider]  amLODipine (NORVASC) 5 MG tablet  Take 5 mg by mouth as directed. If BP>130 Take 1 tablet Daily If Not "HOLD MEDICATION* 07/16/21   [provider]  ondansetron (ZOFRAN-ODT) 4 MG disintegrating tablet 4mg  ODT q4 hours prn nausea/vomit Patient taking differently: Take 4 mg by mouth every 4 (four) hours as needed for  nausea or vomiting. 07/25/21   Milton Ferguson, MD  valsartan-hydrochlorothiazide (DIOVAN-HCT) 320-25 MG tablet Take 1 tablet by mouth as directed. If BP>130 Take 1 tablet Daily If Not "HOLD MEDICATION* 07/08/21   [provider]     History reviewed. No pertinent family history.  Social History   Socioeconomic History   Marital status: Married    Spouse name: Not on file   Number of children: Not on file   Years of education: Not on file   Highest education level: Not on file  Occupational History   Not on file  Tobacco Use   Smoking status: Never   Smokeless tobacco: Never  Vaping Use   Vaping Use: Never used  Substance and Sexual Activity   Alcohol use: Not Currently   Drug use: Not Currently   Sexual activity: Not on file  Other Topics Concern   Not on file  Social History Narrative   Not on file   Social Determinants of Health   Financial Resource Strain: Not on file  Food Insecurity: Not on file  Transportation Needs: Not on file  Physical Activity: Not on file  Stress: Not on file  Social Connections: Not on file     Review of Systems: A 12 point ROS discussed and pertinent positives are indicated in the HPI above.  All other systems are negative.  Vital Signs: BP 108/72 (BP Location: Right Arm)   Pulse 84   Temp 98.1 F (36.7 C) (Oral)   Resp 18   SpO2 96%    Physical Exam Vitals and nursing note reviewed.  Constitutional:      General: He is not in acute distress.    Comments: Pleasant, frail Caucasian male  HENT:     Mouth/Throat:     Pharynx: Oropharynx is clear.  Cardiovascular:     Rate and Rhythm: Normal rate and regular rhythm.     Heart sounds: Normal heart sounds.  Pulmonary:     Effort: Pulmonary effort is normal.     Breath sounds: Normal breath sounds.  Abdominal:     General: Abdomen is flat. Bowel sounds are normal.     Palpations: Abdomen is soft.  Musculoskeletal:     Cervical back: Neck supple.  Skin:    General:  Skin is warm and dry.     Coloration: Skin is not jaundiced or pale.  Neurological:     Mental Status: He is alert and oriented to person, place, and time.  Psychiatric:        Mood and Affect: Mood normal.        Behavior: Behavior normal.        Judgment: Judgment normal.     MD Evaluation Airway: WNL Heart: WNL Abdomen: WNL Chest/ Lungs: WNL ASA  Classification: 3 Mallampati/Airway Score: Two  Imaging: CT ABDOMEN PELVIS W CONTRAST  Result Date: 07/25/2021 CLINICAL DATA:  Abdominal pain EXAM: CT ABDOMEN AND PELVIS WITH CONTRAST TECHNIQUE: Multidetector CT imaging of the abdomen and pelvis was performed using the standard protocol following bolus administration of intravenous contrast. RADIATION DOSE REDUCTION: This exam was performed according to the departmental dose-optimization program which includes automated exposure control, adjustment of the mA  and/or kV according to patient size and/or use of iterative reconstruction technique. CONTRAST:  131mL OMNIPAQUE IOHEXOL 300 MG/ML  SOLN COMPARISON:  None Available. FINDINGS: Lower chest: Coronary artery calcifications are seen. Visualized lower lung fields are clear of any focal infiltrates. Hepatobiliary: There are multiple space-occupying lesions of varying sizes measuring up to 5.4 cm. There is no dilation of bile ducts. Gallbladder stone is seen. Pancreas: There is prominence pancreatic duct. There is 4.3 x 2.4 cm low-density space-occupying lesion in the body of pancreas. Spleen: Unremarkable. Adrenals/Urinary Tract: Adrenals are unremarkable. There is no hydronephrosis. There are no renal or ureteral stones. Urinary bladder is distended. There is no wall thickening in the bladder. Stomach/Bowel: Stomach is unremarkable. Small bowel loops are not dilated. Appendix is not dilated. There is no significant wall thickening in colon. There is no pericolic stranding. Vascular/Lymphatic: Scattered atherosclerotic plaques and calcifications are  seen in the aorta and its major branches. Reproductive: There is marked enlargement of prostate projecting into the base of the bladder. Other: There is no ascites or pneumoperitoneum. Left inguinal hernia containing fat is seen. Musculoskeletal: No focal lytic or sclerotic lesions are seen. Degenerative changes are noted in the thoracic and lumbar spine. IMPRESSION: There is 4.3 cm low-density space-occupying lesion in the body of pancreas. There are multiple space-occupying lesions of varying sizes in the liver. 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 Follow-up PET-CT and tissue sampling should be considered. There is no evidence of intestinal obstruction or pneumoperitoneum. There is no hydronephrosis. Gallbladder stone. Marked enlargement of prostate. Coronary artery calcifications are seen. Other findings as described in the body of the report. Electronically Signed   By: Elmer Picker M.D.   On: 07/25/2021 11:42   DG Chest Port 1 View  Result Date: 07/25/2021 CLINICAL DATA:  Weakness and weight loss EXAM: PORTABLE CHEST 1 VIEW COMPARISON:  None FINDINGS: Artifact overlies the chest. Heart size is normal. Ordinary age related aortic atherosclerosis. The lungs are clear. No evidence of chronic lung disease. No infiltrate, collapse or effusion. No mass or nodule. No abnormal bone finding. IMPRESSION: No active disease. Electronically Signed   By: Nelson Chimes M.D.   On: 07/25/2021 09:43    Labs:  CBC: Recent Labs    08/12/21 1307 08/12/21 1641 08/13/21 0609 08/14/21 0656  WBC 14.1* 12.2* 16.0* 12.4*  HGB 11.6* 10.5* 12.2* 11.1*  HCT 36.7* 32.8* 38.6* 35.2*  PLT 368 306 361 230    COAGS: Recent Labs    08/12/21 1148  INR 1.0    BMP: Recent Labs    08/07/21 0955 08/12/21 1307 08/12/21 1641 08/13/21 0609 08/14/21 0656  NA 132* 136  --  140 135  K 4.7 3.9  --  4.0 3.4*  CL 95* 98  --  103 105  CO2 26  23  --  24 24  GLUCOSE 346* 403*  --  295* 198*  BUN 41* 69*  --  48* 35*  CALCIUM 11.1* 11.8*  --  11.6* 10.9*  CREATININE 1.00 1.17 0.95 0.78 0.55*  GFRNONAA >60 >60 >60 >60 >60    LIVER FUNCTION TESTS: Recent Labs    08/07/21 0955 08/12/21 1307 08/13/21 0609 08/14/21 0656  BILITOT 1.0 1.4* 1.1 0.7  AST 32 26 29 32  ALT 48* 40 38 38  ALKPHOS 398* 328* 286* 286*  PROT 7.0 6.5 6.1* 5.3*  ALBUMIN 2.8* 2.5* 2.4* 2.0*    TUMOR MARKERS: No results for  input(s): "AFPTM", "CEA", "CA199", "CHROMGRNA" in the last 8760 hours.  Assessment and Plan: 81 y.o. male with recent finding of pancreatic mass and liver lesions who is in need of liver lesion biopsy.   VSS CBC with leukocytosis WBC 12.4, not on abx.  Glucose 282 today  INR 1.0 on 7/17  On Lovenox, last give on 10 pm yesterday.   Risks and benefits of liver lesion biopsy was discussed with the patient and/or patient's family including, but not limited to bleeding which may require addition procedures such as angiogram with embolization, infection, damage to adjacent structures or low yield requiring additional tests.  All of the questions were answered and there is agreement to proceed.  Consent signed and in chart.  PLAN - NPO at [facility] - Lovenox held for tonight    Thank you for this interesting consult.  I greatly enjoyed meeting Jeffery Bryant and look forward to participating in their care.  A copy of this report was sent to the requesting provider on this date.  Electronically Signed: Tera Mater, PA-C 08/14/2021, 2:28 PM   I spent a total of 40 Minutes    in face to face in clinical consultation, greater than 50% of which was counseling/coordinating care for liver lesion biopsy.   This chart was dictated using voice recognition software.  Despite best efforts to proofread,  errors can occur which can change the documentation meaning.

## 2021-08-15 ENCOUNTER — Ambulatory Visit (HOSPITAL_COMMUNITY): Payer: Medicare Other

## 2021-08-15 ENCOUNTER — Inpatient Hospital Stay (HOSPITAL_COMMUNITY): Payer: Medicare Other

## 2021-08-15 ENCOUNTER — Ambulatory Visit (HOSPITAL_COMMUNITY): Payer: Medicare Other | Admitting: Hematology

## 2021-08-15 DIAGNOSIS — N179 Acute kidney failure, unspecified: Secondary | ICD-10-CM | POA: Diagnosis not present

## 2021-08-15 LAB — GLUCOSE, CAPILLARY
Glucose-Capillary: 227 mg/dL — ABNORMAL HIGH (ref 70–99)
Glucose-Capillary: 250 mg/dL — ABNORMAL HIGH (ref 70–99)
Glucose-Capillary: 211 mg/dL — ABNORMAL HIGH (ref 70–99)
Glucose-Capillary: 187 mg/dL — ABNORMAL HIGH (ref 70–99)

## 2021-08-15 MED ORDER — MIDAZOLAM HCL 2 MG/2ML IJ SOLN
INTRAMUSCULAR | Status: AC
  Filled 2021-08-15: qty 2

## 2021-08-15 MED ORDER — FENTANYL CITRATE (PF) 100 MCG/2ML IJ SOLN
INTRAMUSCULAR | Status: AC
  Filled 2021-08-15: qty 2

## 2021-08-15 MED ORDER — LIDOCAINE HCL 1 % IJ SOLN
INTRAMUSCULAR | Status: AC
  Administered 2021-08-15: 10 mL
  Filled 2021-08-15: qty 20

## 2021-08-15 MED ORDER — FENTANYL CITRATE (PF) 100 MCG/2ML IJ SOLN
INTRAMUSCULAR | Status: AC | PRN
  Administered 2021-08-15: 25 ug via INTRAVENOUS

## 2021-08-15 MED ORDER — GELATIN ABSORBABLE 12-7 MM EX MISC
CUTANEOUS | Status: AC
  Filled 2021-08-15: qty 1

## 2021-08-15 MED ORDER — LIDOCAINE HCL (PF) 1 % IJ SOLN
INTRAMUSCULAR | Status: AC | PRN
  Administered 2021-08-15: 10 mL via INTRADERMAL

## 2021-08-15 MED ORDER — GELATIN ABSORBABLE 12-7 MM EX MISC
CUTANEOUS | Status: AC | PRN
  Administered 2021-08-15: 1 via TOPICAL

## 2021-08-15 NOTE — Care Management Important Message (Signed)
Important Message  Patient Details IM Letter placed in Patients room. Name: Jeffery Bryant MRN: [id number] Date of Birth: 1940/06/24   Medicare Important Message Given:  Yes     Kerin Salen 08/15/2021, 9:55 AM

## 2021-08-15 NOTE — Procedures (Signed)
Interventional Radiology Procedure Note  Procedure: [place] CORE BX RT LIVER MET    Complications: None  Estimated Blood Loss:  0  Findings: 18 G CORE X 3    M. Daryll Brod, MD

## 2021-08-16 DIAGNOSIS — Z515 Encounter for palliative care: Secondary | ICD-10-CM | POA: Diagnosis not present

## 2021-08-16 DIAGNOSIS — Z7189 Other specified counseling: Secondary | ICD-10-CM | POA: Diagnosis not present

## 2021-08-16 DIAGNOSIS — N179 Acute kidney failure, unspecified: Secondary | ICD-10-CM | POA: Diagnosis not present

## 2021-08-16 DIAGNOSIS — K8689 Other specified diseases of pancreas: Secondary | ICD-10-CM | POA: Diagnosis not present

## 2021-08-16 DIAGNOSIS — Z66 Do not resuscitate: Secondary | ICD-10-CM | POA: Diagnosis not present

## 2021-08-16 DIAGNOSIS — E119 Type 2 diabetes mellitus without complications: Secondary | ICD-10-CM

## 2021-08-16 LAB — CBC WITH DIFFERENTIAL/PLATELET
Abs Immature Granulocytes: 0.12 10*3/uL — ABNORMAL HIGH (ref 0.00–0.07)
Basophils Absolute: 0 10*3/uL (ref 0.0–0.1)
Basophils Relative: 0 %
Eosinophils Absolute: 0 10*3/uL (ref 0.0–0.5)
Eosinophils Relative: 0 %
HCT: 35.5 % — ABNORMAL LOW (ref 39.0–52.0)
Hemoglobin: 11.5 g/dL — ABNORMAL LOW (ref 13.0–17.0)
Immature Granulocytes: 1 %
Lymphocytes Relative: 4 %
Lymphs Abs: 0.7 10*3/uL (ref 0.7–4.0)
MCH: 28.3 pg (ref 26.0–34.0)
MCHC: 32.4 g/dL (ref 30.0–36.0)
MCV: 87.4 fL (ref 80.0–100.0)
Monocytes Absolute: 0.7 10*3/uL (ref 0.1–1.0)
Monocytes Relative: 4 %
Neutro Abs: 15.5 10*3/uL — ABNORMAL HIGH (ref 1.7–7.7)
Neutrophils Relative %: 91 %
Platelets: 278 10*3/uL (ref 150–400)
RBC: 4.06 MIL/uL — ABNORMAL LOW (ref 4.22–5.81)
RDW: 16.3 % — ABNORMAL HIGH (ref 11.5–15.5)
WBC: 17.1 10*3/uL — ABNORMAL HIGH (ref 4.0–10.5)
nRBC: 0 % (ref 0.0–0.2)

## 2021-08-16 LAB — GLUCOSE, CAPILLARY
Glucose-Capillary: 180 mg/dL — ABNORMAL HIGH (ref 70–99)
Glucose-Capillary: 164 mg/dL — ABNORMAL HIGH (ref 70–99)
Glucose-Capillary: 215 mg/dL — ABNORMAL HIGH (ref 70–99)

## 2021-08-16 LAB — BASIC METABOLIC PANEL
Anion gap: 8 (ref 5–15)
BUN: 29 mg/dL — ABNORMAL HIGH (ref 8–23)
CO2: 22 mmol/L (ref 22–32)
Calcium: 8.9 mg/dL (ref 8.9–10.3)
Chloride: 106 mmol/L (ref 98–111)
Creatinine, Ser: 0.71 mg/dL (ref 0.61–1.24)
GFR, Estimated: >60 mL/min (ref >60)
Glucose, Bld: 335 mg/dL — ABNORMAL HIGH (ref 70–99)
Potassium: 3.8 mmol/L (ref 3.5–5.1)
Sodium: 136 mmol/L (ref 135–145)

## 2021-08-16 MED ORDER — MORPHINE SULFATE (CONCENTRATE) 20 MG/ML PO SOLN: 10.0000 mg | ORAL | 0 refills | Status: AC | PRN

## 2021-08-16 MED ORDER — ENSURE ENLIVE PO LIQD: 237.0000 mL | Freq: Two times a day (BID) | ORAL | 12 refills | Status: AC

## 2021-08-16 MED ORDER — HALOPERIDOL 5 MG PO TABS: 5.0000 mg | ORAL_TABLET | ORAL | 0 refills | Status: AC | PRN

## 2021-08-16 MED ORDER — MORPHINE SULFATE (PF) 2 MG/ML IV SOLN: 1.0000 mg | Freq: Once | INTRAVENOUS | Status: DC | PRN

## 2021-08-16 MED ORDER — HYOSCYAMINE SULFATE 0.125 MG SL SUBL: 0.1250 mg | SUBLINGUAL_TABLET | SUBLINGUAL | 0 refills | Status: AC | PRN

## 2021-08-16 MED ORDER — INSULIN GLARGINE-YFGN 100 UNIT/ML ~~LOC~~ SOLN: 6.0000 [IU] | Freq: Every day | SUBCUTANEOUS | 11 refills | Status: AC

## 2021-08-16 MED ORDER — MORPHINE SULFATE (CONCENTRATE) 10 MG/0.5ML PO SOLN
5.0000 mg | ORAL | 0 refills | Status: DC | PRN
Start: 2021-08-16 — End: 2021-08-16

## 2021-08-16 MED ORDER — LORAZEPAM 0.5 MG PO TABS: 0.5000 mg | ORAL_TABLET | ORAL | 0 refills | Status: AC | PRN

## 2021-08-16 MED ORDER — MORPHINE SULFATE (CONCENTRATE) 10 MG/0.5ML PO SOLN: 5.0000 mg | ORAL | Status: DC | PRN

## 2021-08-16 NOTE — Progress Notes (Signed)
PROGRESS NOTE  Jeffery Bryant LKJ:[id number] DOB: 1940/06/14 DOA: 08/12/2021 PCP: Neale Burly, MD  Brief History   Jeffery Bryant is a 81 y.o. male with medical history significant of pancreatic mass with liver lesions came to interventional radiology for biopsy today.  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  His appetite has been poor.  He has no nausea vomiting.  He has generalized weakness unable to walk due to weakness.  His wife could not get him out of bed today to go to the restroom.  He has not taken his medications at home.  His CBG in short stay was 505.  His past medical history significant for hypertension diabetes. No nausea vomiting diarrhea reported he is on Lasix and ACE inhibitor at home. No fever chills cough shortness of breath reported no dysuria reported History is obtained from the family as patient is not able to provide me any history. He did not have the biopsy done this was canceled due to hypoglycemia and he was sent to the ER from interventional radiology.     In the ER he received IV fluids. Blood pressure 108/60 pulse 64 respiration 15 -98% on room air temperature 97.5. Sodium 136 potassium 3.9 BUN 69 creatinine 1.17 calcium 11.8 alkaline phosphatase 328 albumin 2.5 total bilirubin 1.4 CPK 14 Lactic acid 1.7 White count 14.1 hemoglobin 11.6  Oncology has been consulted. The patient has a liver biopsy ordered through IR. This will take place on the later today. It is felt that the patient is unlikely to tolerate any systemic therapy. The patient and family have met with palliative care. Although the patient is not a candidate for aggressive therapy, he was refusing hospice at the time of palliative care consult.   Consultants  Oncology Palliative care Interventional radiology  Procedures  Liver biopsy  Antibiotics   Anti-infectives (From admission, onward)    None       Subjective  The patient is resting comfortably. No new complaints.  Objective   Vitals:  Vitals:   08/15/21 2145 08/16/21 0604  BP: 129/74 120/77  Pulse: 77 75  Resp: 14 14  Temp: 98.9 F (37.2 C) 98.5 F (36.9 C)  SpO2: 98% 100%    Exam:  Constitutional:  The patient is awake, alert, and oriented x 3. No acute distress. Respiratory:  No increased work of breathing. No wheezes, rales, or rhonchi No tactile fremitus Cardiovascular:  Regular rate and rhythm No murmurs, ectopy, or gallups. No lateral PMI. No thrills. Abdomen:  Abdomen is soft, non-tender, non-distended No hernias, masses, or organomegaly Normoactive bowel sounds.  Musculoskeletal:  No cyanosis, clubbing, or edema Skin:  No rashes, lesions, ulcers palpation of skin: no induration or nodules Neurologic:  CN 2-12 intact Sensation all 4 extremities intact Psychiatric:  Mental status Mood, affect appropriate Orientation to person, place, time  judgment and insight appear intact  I have personally reviewed the following:   Today's Data  Vitals  Lab Data  CMP CBC  Micro Data    Imaging    Cardiology Data    Other Data    Scheduled Meds:  enoxaparin (LOVENOX) injection  30 mg Subcutaneous Q24H   feeding supplement  237 mL Oral BID BM   insulin aspart  0-9 Units Subcutaneous TID WC   insulin glargine-yfgn  4 Units Subcutaneous Daily   Continuous Infusions:  sodium chloride 75 mL/hr  at 08/16/21 1000    Problem  DM II (Diabetes Mellitus, Type Ii), Controlled (Hcc)  Hypercalcemia  Pancreatic Mass  Liver Masses   #1 Dehydration due to decreased p.o. intake improved.  Resolving.   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  Patient's family aware we probably will not be able to do a biopsy at this time. Patient is DNR.  Discussed in detail with his wife and daughter at bedside. Biopsy planned for later today. Palliative  consulted. The patient has declined hospice.   #3 AKI resolved.  Secondary to dehydration decreased p.o. intake.  Hold metformin Lasix and hydrochlorothiazide and valsartan he was taking prior to admission.  His blood pressure is still soft 99/60 we will continue NS.   #4 hypercalcemia: secondary to dehydration/malignancy-his calcium was 8.7 in June 2023 and on admission today his calcium is 11.6.  Improving on Zometa.  #5 elevated alkaline phosphatase : likely related to liver lesions.  Total bilirubin 1.4.   #6 iron deficiency anemia multifactorial.  No active bleeding.   #7 type 2 diabetes with hyperglycemia uncontrolled with A1c of 11.1. Start Lantus 40 units nightly with SSI.   I have seen and examined this patient myself. I have spent 34 minutes in his evaluation and care.   Estimated body mass index is 18.37 kg/m as calculated from the following:   Height as of 08/07/21: 5' 7" (1.702 m).   Weight as of 08/07/21: 53.2 kg.   DVT prophylaxis: lovenox Code Status: dnr  Family Communication: dw family Disposition Plan:  Status is: Inpatient Remains inpatient appropriate because: dehydration   LOS: 4 days   Rockie Vawter, DO [facility] Direct contact: see [email or web address]  7PM-7AM contact night coverage as above 08/15/2021, 3:29 PM  LOS: 2 days

## 2021-08-16 NOTE — Discharge Summary (Signed)
Physician Discharge Summary   Patient: Jeffery Bryant MRN: [id number] DOB: [birth year]  Admit date:     08/12/2021  Discharge date: 08/16/21  Discharge Physician: Karie Kirks   PCP: Neale Burly, MD   Recommendations at discharge:    Discharge to home with hospice  Discharge Diagnoses: Principal Problem:   AKI (acute kidney injury) [facility]) Active Problems:   Pancreatic mass   Liver masses   DM II (diabetes mellitus, type II), controlled ([place])   Hypercalcemia  Resolved Problems:   * No resolved hospital problems. *  Hospital Course: Jeffery Bryant is a 81 y.o. male with medical history significant of pancreatic mass with liver lesions came to interventional radiology for biopsy today.  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  His appetite has been poor.  He has no nausea vomiting.  He has generalized weakness unable to walk due to weakness.  His wife could not get him out of bed today to go to the restroom.  He has not taken his medications at home.  His CBG in short stay was 505.  His past medical history significant for hypertension diabetes. No nausea vomiting diarrhea reported he is on Lasix and ACE inhibitor at home. No fever chills cough shortness of breath reported no dysuria reported History is obtained from the family as patient is not able to provide me any history. He did not have the biopsy done this was canceled due to hypoglycemia and he was sent to the ER from interventional radiology.     In the ER he received IV fluids. Blood pressure 108/60 pulse 64 respiration 15 -98% on room air temperature 97.5. Sodium 136 potassium 3.9 BUN 69 creatinine 1.17 calcium 11.8 alkaline phosphatase 328 albumin 2.5 total bilirubin 1.4 CPK 14 Lactic acid 1.7 White count 14.1 hemoglobin 11.6   Oncology has been consulted. The patient has a liver biopsy ordered through IR. This will take place on the later  today. It is felt that the patient is unlikely to tolerate any systemic therapy. The patient and family have met with palliative care. Although the patient is not a candidate for aggressive therapy, he was refusing hospice at the time of palliative care consult.   The patient underwent biopsy of the hepatic lesion on 08/15/2021. He tolerated the procedure well.  Today the family has noted that the patient has continued on his rapid decline with continued confusion. The patient is no longer competent to make his own decision due to confusion and lack of orientation to time, date, location, or situation. The family asked to reconsult palliative care in hope of discharging the patient to home on hospice.  Palliative care has returned and spoken with the patient's daughter and wife. The patient will be discharged to home today with hospice.  Assessment and Plan: #1 Dehydration due to decreased p.o. intake improved.  Resolving.   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  Patient's family aware we probably will not be able to do a biopsy at this time. Patient is DNR.  Discussed in detail with his wife and daughter at bedside. Biopsy planned for later today. Palliative consulted. The patient has declined hospice.   #3 AKI resolved.  Secondary to dehydration decreased p.o. intake.  Hold metformin Lasix and hydrochlorothiazide and valsartan he was taking prior to admission.  His blood  pressure is still soft 99/60 we will continue NS.   #4 hypercalcemia: secondary to dehydration/malignancy-his calcium was 8.7 in June 2023 and on admission today his calcium is 11.6.  Improving on Zometa.   #5 elevated alkaline phosphatase : likely related to liver lesions.  Total bilirubin 1.4.   #6 iron deficiency anemia multifactorial.  No active bleeding.   #7 type 2 diabetes with hyperglycemia uncontrolled with A1c of 11.1. Will discharge to home on Lantus 6 units  daily.    Consultants: IR,  Procedures performed: Liver biopsy  Disposition: Hospice care Diet recommendation:  Discharge Diet Orders (From admission, onward)     Start     Ordered   08/16/21 0000  Diet general        08/16/21 1438           Regular diet DISCHARGE MEDICATION: Allergies as of 08/16/2021       Reactions   Ciprofloxacin Rash        Medication List     STOP taking these medications    acarbose 100 MG tablet Commonly known as: PRECOSE   amLODipine 5 MG tablet Commonly known as: NORVASC   furosemide 20 MG tablet Commonly known as: LASIX   Jardiance 10 MG Tabs tablet Generic drug: empagliflozin   metFORMIN 1000 MG tablet Commonly known as: GLUCOPHAGE   valsartan-hydrochlorothiazide 320-25 MG tablet Commonly known as: DIOVAN-HCT       TAKE these medications    Bydureon BCise 2 MG/0.85ML Auij Generic drug: Exenatide ER Inject 1 Syringe into the skin once a week.   feeding supplement Liqd Take 237 mLs by mouth 2 (two) times daily between meals.   haloperidol 5 MG tablet Commonly known as: HALDOL Take 1 tablet (5 mg total) by mouth every 4 (four) hours as needed for agitation. May crush, mix with water and give sublingually if needed.   hyoscyamine 0.125 MG SL tablet Commonly known as: LEVSIN SL Place 1 tablet (0.125 mg total) under the tongue every 4 (four) hours as needed (excess oral secretions).   insulin glargine-yfgn 100 UNIT/ML injection Commonly known as: SEMGLEE Inject 0.06 mLs (6 Units total) into the skin daily. Start taking on: August 17, 2021   LORazepam 0.5 MG tablet Commonly known as: ATIVAN Take 1 tablet (0.5 mg total) by mouth every 4 (four) hours as needed for anxiety. May crush, mix with water and give sublingually if needed.   morphine 20 MG/ML concentrated solution Commonly known as: ROXANOL Take 0.5 mLs (10 mg total) by mouth every 4 (four) hours as needed for severe pain, shortness of breath or moderate pain.  May give sublingually if needed.   ondansetron 4 MG disintegrating tablet Commonly known as: ZOFRAN-ODT $RemoveBefore'4mg'ObkxcMwPMoAnR$  ODT q4 hours prn nausea/vomit What changed:  how much to take how to take this when to take this reasons to take this additional instructions               Durable Medical Equipment  (From admission, onward)           Start     Ordered   08/16/21 1338  For home use only DME Hospital bed  Once       Question Answer Comment  Length of Need Lifetime   The above medical condition requires: Patient requires the ability to reposition frequently   Bed type Semi-electric      08/16/21 1337            Discharge Exam: There were no  vitals filed for this visit. Exam:  Constitutional:  The patient is awake, alert, and oriented x 3. No acute distress. Respiratory:  No increased work of breathing. No wheezes, rales, or rhonchi No tactile fremitus Cardiovascular:  Regular rate and rhythm No murmurs, ectopy, or gallups. No lateral PMI. No thrills. Abdomen:  Abdomen is soft, non-tender, non-distended No hernias, masses, or organomegaly Normoactive bowel sounds.  Musculoskeletal:  No cyanosis, clubbing, or edema Skin:  No rashes, lesions, ulcers palpation of skin: no induration or nodules Neurologic:  CN 2-12 intact Sensation all 4 extremities intact Psychiatric:  Mental status Mood, affect appropriate Orientation to person, place, time  judgment and insight appear intact   Condition at discharge: fair  The results of significant diagnostics from this hospitalization (including imaging, microbiology, ancillary and laboratory) are listed below for reference.   Imaging Studies: US BIOPSY (LIVER)  Result Date: 08/15/2021 INDICATION: Liver metastases EXAM: ULTRASOUND CORE BIOPSY ANTERIOR LEFT LIVER METASTASIS MEDICATIONS: 1% LIDOCAINE LOCAL ANESTHESIA/SEDATION: Moderate (conscious) sedation was employed during this procedure. A total of Versed 0 mg  and Fentanyl 25 mcg was administered intravenously by the radiology nurse. Total intra-service moderate Sedation Time: 8 minutes. The patient's level of consciousness and vital signs were monitored continuously by radiology nursing throughout the procedure under my direct supervision. COMPLICATIONS: None immediate. PROCEDURE: Informed written consent was obtained from the patient after a thorough discussion of the procedural risks, benefits and alternatives. All questions were addressed. Maximal Sterile Barrier Technique was utilized including caps, mask, sterile gowns, sterile gloves, sterile drape, hand hygiene and skin antiseptic. A timeout was performed prior to the initiation of the procedure. Previous imaging reviewed. Preliminary ultrasound performed. An anterior left hepatic lobe solid hypoechoic lesion was localized and marked for biopsy. Under sterile conditions and local anesthesia, the 17 gauge guide needle was advanced from a subcostal angled approach into the left hepatic lesion. Needle position confirmed with ultrasound. Images obtained for documentation. 18 gauge core biopsies obtained under direct ultrasound. Three biopsies obtained. These were intact and non fragmented. Samples placed in formalin. Needle tract occluded with Gel-Foam. Postprocedure imaging demonstrates no hemorrhage or hematoma. Patient tolerated biopsy well. IMPRESSION: Successful ultrasound anterior left hepatic metastasis 18 gauge core biopsy Electronically Signed   By: Jerilynn Mages.  Shick M.D.   On: 08/15/2021 15:09   CT ABDOMEN PELVIS W CONTRAST  Result Date: 07/25/2021 CLINICAL DATA:  Abdominal pain EXAM: CT ABDOMEN AND PELVIS WITH CONTRAST TECHNIQUE: Multidetector CT imaging of the abdomen and pelvis was performed using the standard protocol following bolus administration of intravenous contrast. RADIATION DOSE REDUCTION: This exam was performed according to the departmental dose-optimization program which includes automated  exposure control, adjustment of the mA and/or kV according to patient size and/or use of iterative reconstruction technique. CONTRAST:  114mL OMNIPAQUE IOHEXOL 300 MG/ML  SOLN COMPARISON:  None Available. FINDINGS: Lower chest: Coronary artery calcifications are seen. Visualized lower lung fields are clear of any focal infiltrates. Hepatobiliary: There are multiple space-occupying lesions of varying sizes measuring up to 5.4 cm. There is no dilation of bile ducts. Gallbladder stone is seen. Pancreas: There is prominence pancreatic duct. There is 4.3 x 2.4 cm low-density space-occupying lesion in the body of pancreas. Spleen: Unremarkable. Adrenals/Urinary Tract: Adrenals are unremarkable. There is no hydronephrosis. There are no renal or ureteral stones. Urinary bladder is distended. There is no wall thickening in the bladder. Stomach/Bowel: Stomach is unremarkable. Small bowel loops are not dilated. Appendix is not dilated. There is no significant wall thickening  in colon. There is no pericolic stranding. Vascular/Lymphatic: Scattered atherosclerotic plaques and calcifications are seen in the aorta and its major branches. Reproductive: There is marked enlargement of prostate projecting into the base of the bladder. Other: There is no ascites or pneumoperitoneum. Left inguinal hernia containing fat is seen. Musculoskeletal: No focal lytic or sclerotic lesions are seen. Degenerative changes are noted in the thoracic and lumbar spine. IMPRESSION: There is 4.3 cm low-density space-occupying lesion in the body of pancreas. There are multiple space-occupying lesions of varying sizes in the liver. 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 Follow-up PET-CT and tissue sampling should be considered. There is no evidence of intestinal obstruction or pneumoperitoneum. There is no hydronephrosis. Gallbladder stone. Marked enlargement of prostate. Coronary  artery calcifications are seen. Other findings as described in the body of the report. Electronically Signed   By: Elmer Picker M.D.   On: 07/25/2021 11:42   DG Chest Port 1 View  Result Date: 07/25/2021 CLINICAL DATA:  Weakness and weight loss EXAM: PORTABLE CHEST 1 VIEW COMPARISON:  None FINDINGS: Artifact overlies the chest. Heart size is normal. Ordinary age related aortic atherosclerosis. The lungs are clear. No evidence of chronic lung disease. No infiltrate, collapse or effusion. No mass or nodule. No abnormal bone finding. IMPRESSION: No active disease. Electronically Signed   By: Nelson Chimes M.D.   On: 07/25/2021 09:43    Microbiology: No results found for this or any previous visit.  Labs: CBC: Recent Labs  Lab 08/12/21 1307 08/12/21 1641 08/13/21 0609 08/14/21 0656 08/16/21 1158  WBC 14.1* 12.2* 16.0* 12.4* 17.1*  NEUTROABS 13.2*  --   --   --  15.5*  HGB 11.6* 10.5* 12.2* 11.1* 11.5*  HCT 36.7* 32.8* 38.6* 35.2* 35.5*  MCV 89.3 88.2 90.6 90.0 87.4  PLT 368 306 361 230 356   Basic Metabolic Panel: Recent Labs  Lab 08/12/21 1307 08/12/21 1641 08/13/21 0609 08/14/21 0656 08/16/21 1158  NA 136  --  140 135 136  K 3.9  --  4.0 3.4* 3.8  CL 98  --  103 105 106  CO2 23  --  $R'24 24 22  'GJ$ GLUCOSE 403*  --  295* 198* 335*  BUN 69*  --  48* 35* 29*  CREATININE 1.17 0.95 0.78 0.55* 0.71  CALCIUM 11.8*  --  11.6* 10.9* 8.9   Liver Function Tests: Recent Labs  Lab 08/12/21 1307 08/13/21 0609 08/14/21 0656  AST 26 29 32  ALT 40 38 38  ALKPHOS 328* 286* 286*  BILITOT 1.4* 1.1 0.7  PROT 6.5 6.1* 5.3*  ALBUMIN 2.5* 2.4* 2.0*   CBG: Recent Labs  Lab 08/15/21 0827 08/15/21 1119 08/15/21 1727 08/15/21 2151 08/16/21 0731  GLUCAP 211* 187* 180* 164* 215*    Discharge time spent: greater than 30 minutes.  Signed: Ihsan Nomura, DO [facility] 08/16/2021

## 2021-08-16 NOTE — Progress Notes (Signed)
Daily Progress Note   Patient Name: Jeffery Bryant       Date: 08/16/2021 DOB: 1940-10-31  Age: 81 y.o. MRN#: [id number] Attending Physician: Karie Kirks, DO Primary Care Physician: Neale Burly, MD Admit Date: 08/12/2021  Reason for Consultation/Follow-up: Establishing goals of care  Subjective: Patient more confused today - does not remember getting biopsy, thinks he still needs this. Denies pain but did have some pain overnight. Wife and daughter at bedside.   Length of Stay: 4  Current Medications: Scheduled Meds:   enoxaparin (LOVENOX) injection  30 mg Subcutaneous Q24H   feeding supplement  237 mL Oral BID BM   insulin aspart  0-9 Units Subcutaneous TID WC   insulin glargine-yfgn  4 Units Subcutaneous Daily    Continuous Infusions:  sodium chloride 75 mL/hr at 08/16/21 1000    PRN Meds: morphine injection, morphine CONCENTRATE, ondansetron (ZOFRAN) IV  Physical Exam Constitutional:      General: He is not in acute distress.    Appearance: He is ill-appearing.     Comments: lethargic  Pulmonary:     Effort: Pulmonary effort is normal.  Skin:    General: Skin is warm and dry.  Neurological:     Mental Status: He is disoriented.             Vital Signs: BP 114/60 (BP Location: Right Arm)   Pulse 78   Temp 97.8 F (36.6 C) (Oral)   Resp 18   SpO2 100%  SpO2: SpO2: 100 % O2 Device: O2 Device: Room Air O2 Flow Rate: O2 Flow Rate (L/min): 2 L/min  Intake/output summary:  Intake/Output Summary (Last 24 hours) at 08/16/2021 1459 Last data filed at 08/16/2021 1000 Gross per 24 hour  Intake 1289.43 ml  Output 600 ml  Net 689.43 ml    LBM:   Baseline Weight:   Most recent weight:         Palliative Assessment/Data: PPS 40%      Patient Active Problem List    Diagnosis Date Noted   DM II (diabetes mellitus, type II), controlled ([place]) 08/16/2021   Hypercalcemia 08/16/2021   AKI (acute kidney injury) ([place]) 08/12/2021   Pancreatic mass 08/07/2021   Liver masses 08/07/2021    Palliative Care Assessment & Plan   HPI: 81 y.o. male  with past medical history of pancreatic mass with liver lesions admitted on 08/12/2021 from IR after biopsy attempt was unsuccessful d/t short stay/pre-procedure CBG being over 500. Pt is being treated for dehydration, AKI secondary to dehydration, hypercalcemia, elevated alkaline phosphatase, and iron deficiency anemia.   Palliative medicine team was consulted to discuss goals of care.  Assessment: Follow up today with patient's daughter. We discuss decline in patient's cognition and function. She shares that just last week patient was walking and now he is so weak he is unable to feed himself. We also discuss his cognitive decline and increased confusion. Daughter shares the family is interested in getting the patient home as they understand prolonged hospitalization is not adding to quality of life or improving his status. We discuss options for care at home: Hospice versus home health.  Daughter shares they are not interested in any sort of  facility placement.  After discussion of home health support versus hospice support daughter shares she feels hospice support is most appropriate and I agree.  We discussed philosophy of hospice care and type of support provided.  She shares that patient's wife also agrees to hospice support in the home.  They would prefer a discharge today so they can get him home prior to the weekend.  We discussed inserting Foley catheter manage output at home.  We discussed some risk of infection.  She understands.  We discussed adding medication to assist with pain as needed.  She agrees.    Recommendations/Plan: Home with hospice today  Foley catheter insertion prior to discharge Added PRN roxanol  PO  Code Status: DNR  Care plan was discussed with patient's daughter - spouse agrees to plan per daughter's report, Dr Benny Lennert and RN  Thank you for allowing the Palliative Medicine Team to assist in the care of this patient.  *Please note that this is a verbal dictation therefore any spelling or grammatical errors are due to the "[place] [employer]" system interpretation.  Juel Burrow, DNP, [facility] Palliative Medicine Team Team Phone # [phone]  Pager [phone]

## 2021-08-16 NOTE — TOC Transition Note (Signed)
Transition of Care [facility] - CM/SW Discharge Note   Patient Details  Name: Jeffery Bryant MRN: [id number] Date of Birth: [birth year]  [facility]) CM/SW Contact:  Vassie Moselle, [place] Phone Number: 08/16/2021, 2:06 PM   Clinical Narrative:    Pt recommended for home with hospice care. Pt has been referred to [facility]. Hospital bed is to be delivered to home today and will confirm delivery with CSW. [facility] will be able to see pt at home today post discharge.    Final next level of care: Home w Hospice Care Barriers to Discharge: Continued Medical Work up   Patient Goals and CMS Choice Patient states their goals for this hospitalization and ongoing recovery are:: To return home   Choice offered to / list presented to : Patient, Spouse, Adult Children  Discharge Placement                       Discharge Plan and Services In-house Referral: Hospice / Palliative Care Discharge Planning Services: NA Post Acute Care Choice: Hospice          DME Arranged: Hospital bed DME Agency: Other - Comment ([place]) Date DME Agency Contacted: 08/16/21 Time DME Agency Contacted: [phone] Representative spoke with at DME Agency: [place] ([facility]) Interventions     Readmission Risk Interventions    08/16/2021    2:04 PM 08/14/2021    9:05 AM  Readmission Risk Prevention Plan  Post Dischage Appt Complete Complete  Medication Screening Complete Complete  Transportation Screening Complete Complete

## 2021-08-19 ENCOUNTER — Ambulatory Visit (HOSPITAL_COMMUNITY): Payer: Medicare Other

## 2021-08-19 LAB — GLUCOSE, CAPILLARY: Glucose-Capillary: 331 mg/dL — ABNORMAL HIGH (ref 70–99)

## 2021-08-20 ENCOUNTER — Ambulatory Visit (HOSPITAL_COMMUNITY): Payer: Medicare Other | Admitting: Hematology

## 2021-08-21 LAB — SURGICAL PATHOLOGY

## 2021-08-24 ENCOUNTER — Encounter (HOSPITAL_COMMUNITY): Payer: Self-pay

## 2021-08-27 DIAGNOSIS — R159 Full incontinence of feces: Secondary | ICD-10-CM | POA: Diagnosis not present

## 2021-08-27 DIAGNOSIS — C259 Malignant neoplasm of pancreas, unspecified: Secondary | ICD-10-CM | POA: Diagnosis not present

## 2021-08-27 DIAGNOSIS — Z7401 Bed confinement status: Secondary | ICD-10-CM | POA: Diagnosis not present

## 2021-08-27 DIAGNOSIS — R1310 Dysphagia, unspecified: Secondary | ICD-10-CM | POA: Diagnosis not present

## 2021-08-27 DIAGNOSIS — R531 Weakness: Secondary | ICD-10-CM | POA: Diagnosis not present

## 2021-08-27 DIAGNOSIS — R32 Unspecified urinary incontinence: Secondary | ICD-10-CM | POA: Diagnosis not present

## 2021-08-27 DIAGNOSIS — Z515 Encounter for palliative care: Secondary | ICD-10-CM | POA: Diagnosis not present

## 2021-11-16 IMAGING — MR MR PROSTATE WO/W CM
12 series · 48 of 48 positions shown · IV contrast (16ml Multihance)
Comparison: None.

CLINICAL DATA: Elevated PSA. Prostate carcinoma, Gleason score
3+3=6. Active surveillance.

EXAM:
MR PROSTATE WITHOUT AND WITH CONTRAST
TECHNIQUE: Multiplanar multisequence MRI images were obtained of the pelvis
centered about the prostate. Pre and post contrast images were
obtained.
CONTRAST:  15mL MULTIHANCE GADOBENATE DIMEGLUMINE 529 MG/ML IV SOLN

[Series 3: T2 · coronal · 3.0mm · 0.56mm/px · 1 of 23 slices shown (1 of 3)]
[im 1/23]
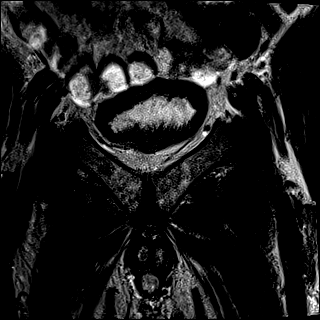

[Series 4: T1 · axial · 5.0mm · 1.25mm/px · 1 of 80 slices shown]
[im 1/80]
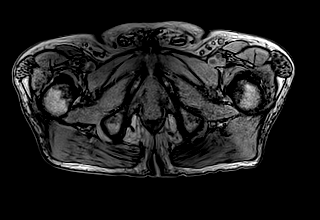

[Series 5: DWI · axial · 3.0mm · 1.75mm/px · 1 of 81 slices shown (1 of 3)]
[im 1/81]
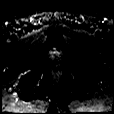

[Series 6: DWI · axial · 3.0mm · 1.75mm/px · 1 of 27 slices shown (2 of 3)]
[im 1/27]
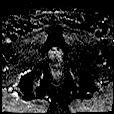

[Series 7: DWI · axial · 3.0mm · 1.75mm/px · 1 of 27 slices shown (3 of 3)]
[im 1/27]
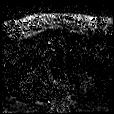

[Series 8: T2 · axial · 3.0mm · 0.56mm/px · 1 of 30 slices shown (2 of 3)]
[im 1/30]
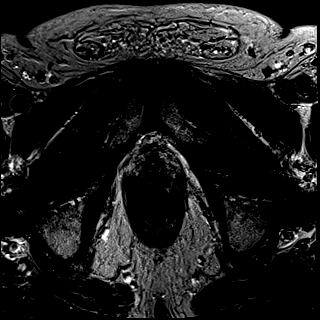

[Series 9: T2 · axial · 1.0mm · 1.04mm/px · z∈[+0,+80]mm · 2 of 80 slices shown (3 of 3)]
[im 1/80]
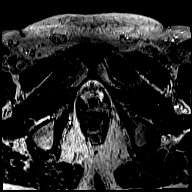
[im 80/80]
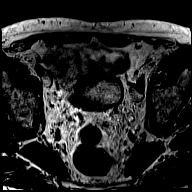

[Series 10: pre t1_twist_tra_dyn · axial · non-contrast · 3.5mm · 0.83mm/px · 1 of 26 slices shown]
[im 1/26]
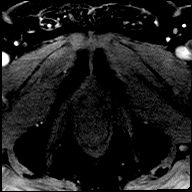

[Series 11: post t1_twist_tra_dyn-copy center · axial · non-contrast · 3.5mm · 0.83mm/px · z∈[-4,+84]mm · 18 of 780 slices shown]
[im 1/780]
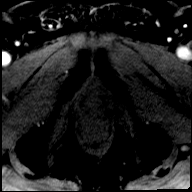
[im 46/780]
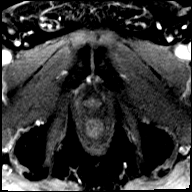
[im 92/780]
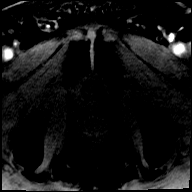
[im 138/780]
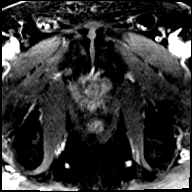
[im 184/780]
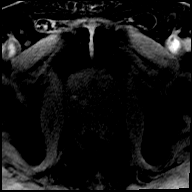
[im 230/780]
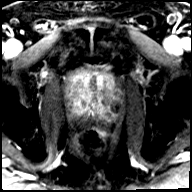
[im 275/780]
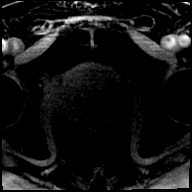
[im 321/780]
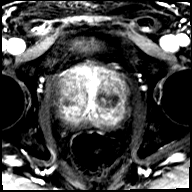
[im 367/780]
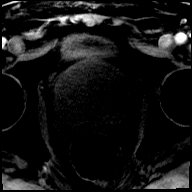
[im 413/780]
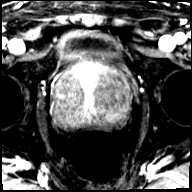
[im 459/780]
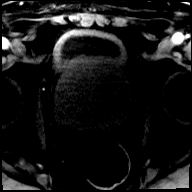
[im 505/780]
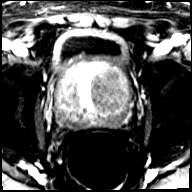
[im 550/780]
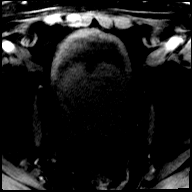
[im 596/780]
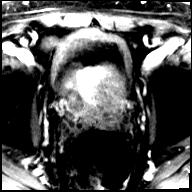
[im 642/780]
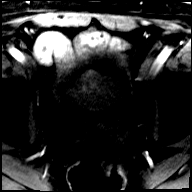
[im 688/780]
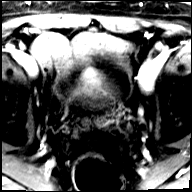
[im 734/780]
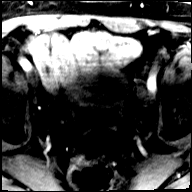
[im 780/780]
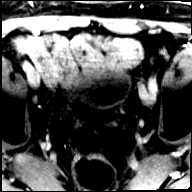

[Series 12: post t1_twist_tra_dyn-copy cent_sub · axial · 3.5mm · 0.83mm/px · z∈[-4,+84]mm · 17 of 750 slices shown]
[im 1/750]
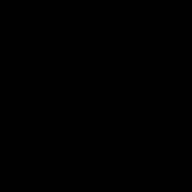
[im 47/750]
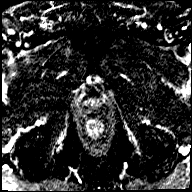
[im 94/750]
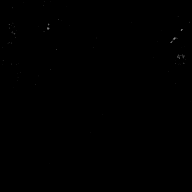
[im 141/750]
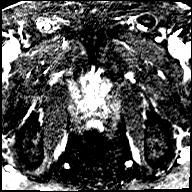
[im 188/750]
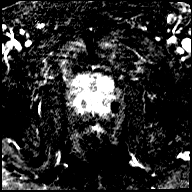
[im 235/750]
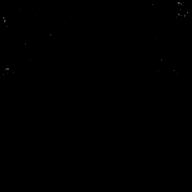
[im 281/750]
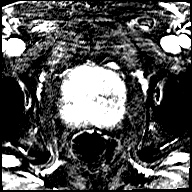
[im 328/750]
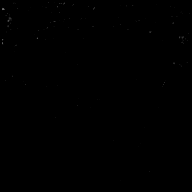
[im 375/750]
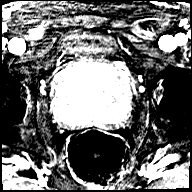
[im 422/750]
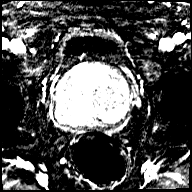
[im 469/750]
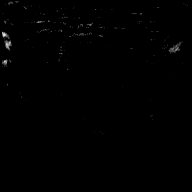
[im 515/750]
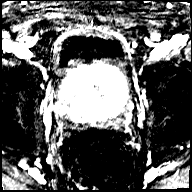
[im 562/750]
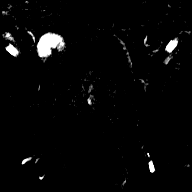
[im 609/750]
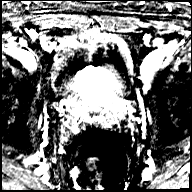
[im 656/750]
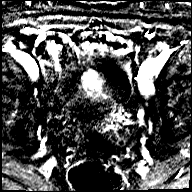
[im 703/750]
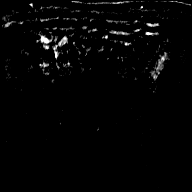
[im 750/750]
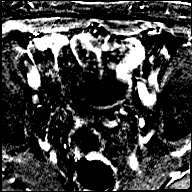

[Series 13: t1_vibe_dixon_tra_f · axial · 2.5mm · 0.91mm/px · z∈[-7,+191]mm · 2 of 80 slices shown]
[im 1/80]
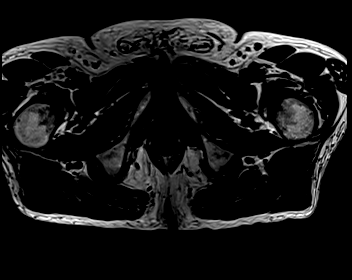
[im 80/80]
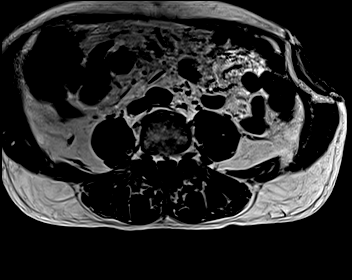

[Series 14: t1_vibe_dixon_tra_w · axial · 2.5mm · 0.91mm/px · z∈[-7,+191]mm · 2 of 80 slices shown]
[im 1/80]
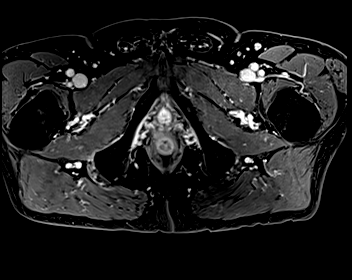
[im 80/80]
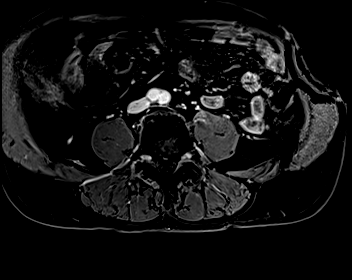

[48 of 48 positions shown; findings below may reference images not displayed]

FINDINGS: Prostate:

-- Peripheral Zone: Diffuse thinning is seen due to central gland
hypertrophy. No abnormality seen on ADC and high b-value DWI
sequences.

-- Transition/Central Zone: Circumscribed BPH nodules are noted, but
no suspicious nodules with obscured or non-circumscribed margins
seen.

-- Measurements/Volume:  6.7 x 5.8 x 6.8 cm (volume = 140 cm^3)

Transcapsular spread:  Absent

Seminal vesicle involvement:  Absent

Neurovascular bundle involvement:  Absent

Pelvic adenopathy: None visualized

Bone metastasis: None visualized

Other:  None
IMPRESSION: No radiographic evidence of high-grade prostate carcinoma. PI-RADS
1: Very Low (clinically significant cancer is highly unlikely to be
present)
